# Patient Record
Sex: Male | Born: 1970 | Race: White | Hispanic: No | Marital: Single | Smoking: Former smoker
Health system: Southern US, Community
[De-identification: ages and names within clinical notes are randomized; demographics above are authoritative.]

## PROBLEM LIST (undated history)

## (undated) DIAGNOSIS — K219 Gastro-esophageal reflux disease without esophagitis: Secondary | ICD-10-CM

## (undated) DIAGNOSIS — T783XXA Angioneurotic edema, initial encounter: Secondary | ICD-10-CM

## (undated) HISTORY — DX: Gastro-esophageal reflux disease without esophagitis: K21.9

## (undated) HISTORY — DX: Angioneurotic edema, initial encounter: T78.3XXA

## (undated) HISTORY — PX: NO PAST SURGERIES: SHX2092

---

## 2012-09-11 ENCOUNTER — Other Ambulatory Visit: Payer: Self-pay | Admitting: Internal Medicine

## 2012-09-11 DIAGNOSIS — R109 Unspecified abdominal pain: Secondary | ICD-10-CM

## 2012-09-20 ENCOUNTER — Ambulatory Visit
Admission: RE | Admit: 2012-09-20 | Discharge: 2012-09-20 | Disposition: A | Discharge status: Home / Self Care | Payer: PRIVATE HEALTH INSURANCE | Source: Ambulatory Visit | Attending: Internal Medicine | Admitting: Internal Medicine

## 2012-09-20 ENCOUNTER — Other Ambulatory Visit: Payer: Self-pay

## 2012-09-20 DIAGNOSIS — R109 Unspecified abdominal pain: Secondary | ICD-10-CM

## 2012-09-20 MED ORDER — IOHEXOL 300 MG/ML  SOLN
125.0000 mL | Freq: Once | INTRAMUSCULAR | Status: AC | PRN
  Administered 2012-09-20: 125 mL via INTRAVENOUS

## 2014-01-23 DIAGNOSIS — Z0000 Encounter for general adult medical examination without abnormal findings: Secondary | ICD-10-CM | POA: Insufficient documentation

## 2021-03-17 ENCOUNTER — Other Ambulatory Visit: Payer: Self-pay | Admitting: Internal Medicine

## 2021-03-17 DIAGNOSIS — I10 Essential (primary) hypertension: Secondary | ICD-10-CM

## 2021-04-17 ENCOUNTER — Encounter: Payer: Self-pay | Admitting: Family Medicine

## 2021-05-21 ENCOUNTER — Ambulatory Visit
Admission: RE | Admit: 2021-05-21 | Discharge: 2021-05-21 | Disposition: A | Discharge status: Home / Self Care | Payer: No Typology Code available for payment source | Source: Ambulatory Visit | Attending: Internal Medicine | Admitting: Internal Medicine

## 2021-05-21 DIAGNOSIS — I10 Essential (primary) hypertension: Secondary | ICD-10-CM

## 2021-06-25 ENCOUNTER — Other Ambulatory Visit: Payer: Self-pay

## 2021-06-25 DIAGNOSIS — K219 Gastro-esophageal reflux disease without esophagitis: Secondary | ICD-10-CM | POA: Insufficient documentation

## 2021-06-26 ENCOUNTER — Other Ambulatory Visit: Payer: Self-pay

## 2021-06-26 ENCOUNTER — Ambulatory Visit (INDEPENDENT_AMBULATORY_CARE_PROVIDER_SITE_OTHER): Payer: Self-pay | Admitting: Cardiology

## 2021-06-26 ENCOUNTER — Encounter: Payer: Self-pay | Admitting: Cardiology

## 2021-06-26 VITALS — BP 110/72 | HR 87 | Ht 73.0 in | Wt 210.0 lb

## 2021-06-26 DIAGNOSIS — I10 Essential (primary) hypertension: Secondary | ICD-10-CM

## 2021-06-26 DIAGNOSIS — I2584 Coronary atherosclerosis due to calcified coronary lesion: Secondary | ICD-10-CM

## 2021-06-26 DIAGNOSIS — I2510 Atherosclerotic heart disease of native coronary artery without angina pectoris: Secondary | ICD-10-CM

## 2021-06-26 DIAGNOSIS — R9431 Abnormal electrocardiogram [ECG] [EKG]: Secondary | ICD-10-CM

## 2021-06-26 DIAGNOSIS — E785 Hyperlipidemia, unspecified: Secondary | ICD-10-CM

## 2021-06-26 DIAGNOSIS — R079 Chest pain, unspecified: Secondary | ICD-10-CM

## 2021-06-26 MED ORDER — METOPROLOL TARTRATE 50 MG PO TABS: ORAL_TABLET | ORAL | 0 refills | Status: DC

## 2021-06-26 MED ORDER — NITROGLYCERIN 0.4 MG SL SUBL: 0.4000 mg | SUBLINGUAL_TABLET | SUBLINGUAL | 3 refills | Status: DC | PRN

## 2021-06-26 NOTE — Patient Instructions (Addendum)
Medication Instructions:  Your physician has recommended you make the following change in your medication:  START: Nitroglycerin 0.4 mg take one tablet by mouth every 5 minutes up to three times as needed for chest pain.  *If you need a refill on your cardiac medications before your next appointment, please call your pharmacy*   Lab Work: Your physician recommends that you return for lab work in:  3-7 days beofre CT scan: BMET, Mag, Lpa - please come fasting If you have labs (blood work) drawn today and your tests are completely normal, you will receive your results only by: MyChart Message (if you have MyChart) OR A paper copy in the mail If you have any lab test that is abnormal or we need to change your treatment, we will call you to review the results.   Testing/Procedures:   Your cardiac CT will be scheduled at one of the below locations:   [facility] [address] [place], [place] [address] [phone]    If scheduled at [facility], please arrive at the [facility] main entrance (entrance A) of [facility] 30 minutes prior to test start time. Proceed to the [facility] Radiology Department (first floor) to check-in and test prep.    Please follow these instructions carefully (unless otherwise directed):  Hold all erectile dysfunction medications at least 3 days (72 hrs) prior to test.  On the Night Before the Test: Be sure to Drink plenty of water. Do not consume any caffeinated/decaffeinated beverages or chocolate 12 hours prior to your test. Do not take any antihistamines 12 hours prior to your test.   On the Day of the Test: Drink plenty of water until 1 hour prior to the test. Do not eat any food 4 hours prior to the test. You may take your regular medications prior to the test.  Take metoprolol (Lopressor) two hours prior to test.   After the Test: Drink plenty of water. After receiving IV contrast, you may experience a  mild flushed feeling. This is normal. On occasion, you may experience a mild rash up to 24 hours after the test. This is not dangerous. If this occurs, you can take Benadryl 25 mg and increase your fluid intake. If you experience trouble breathing, this can be serious. If it is severe call 911 IMMEDIATELY. If it is mild, please call our office. If you take any of these medications: Glipizide/Metformin, Avandament, Glucavance, please do not take 48 hours after completing test unless otherwise instructed.  Please allow 2-4 weeks for scheduling of routine cardiac CTs. Some insurance companies require a pre-authorization which may delay scheduling of this test.   For non-scheduling related questions, please contact the cardiac imaging nurse navigator should you have any questions/concerns: Rockwell Alexandria, Cardiac Imaging Nurse Navigator Larey Brick, Cardiac Imaging Nurse Navigator [facility] Direct Office Dial: [phone]   For scheduling needs, including cancellations and rescheduling, please call [place], [phone].    Follow-Up: At [facility], you and your health needs are our priority.  As part of our continuing mission to provide you with exceptional heart care, we have created designated Provider Care Teams.  These Care Teams include your primary Cardiologist (physician) and Advanced Practice Providers (APPs -  Physician Assistants and Nurse Practitioners) who all work together to provide you with the care you need, when you need it.  We recommend signing up for the patient portal called "MyChart".  Sign up information is provided on  this After Visit Summary.  MyChart is used to connect with patients for Virtual Visits (Telemedicine).  Patients are able to view lab/test results, encounter notes, upcoming appointments, etc.  Non-urgent messages can be sent to your provider as well.   To learn more about what you can do with MyChart, go to  [email or web address].    Your next appointment:   4 month(s)  The format for your next appointment:   In Person  Provider:   Elease Hashimoto - Thomasene Ripple, DO [address], [place], [place] [address]    Other Instructions

## 2021-06-26 NOTE — Progress Notes (Signed)
Cardiology Office Note:    Date:  06/26/2021   ID:  Trellis Moment, DOB [birth year], MRN [id number]  PCP:  Merri Brunette, MD  Cardiologist:  None  Electrophysiologist:  None   Referring MD: Merri Brunette, MD    History of Present Illness:    Larry Barber is a 50 y.o. male with a history significant for HTN, HLD, and former tobacco and alcohol use who was referred due to coronary calcium score of 610 (99%ile for age).   Patient denies any symptoms and feels well overall. He denies chest pain, dyspnea, dizziness/lightheadedness, or syncope. States the news of his calcium score and recent diagnosis of high cholesterol were a shock to him.   Patient reports he drank heavily and smoked 1PPD for approximately 20 years. One year ago he quit smoking and drinking, and has been committed to a much healthier lifestyle since that time. He is very active, enjoys playing golf and exercising in his home gym. He works with a Health and safety inspector and follows a Mediterranean-style diet. He has a longstanding history of HTN, which is well controlled on meds, and was recently diagnosed with high cholesterol. Family history significant for father with CABG at age 40 and paternal grandfather who died of sudden MI in his early 25s.   Past Medical History:  Diagnosis Date   GERD (gastroesophageal reflux disease)     Past Surgical History:  Procedure Laterality Date   NO PAST SURGERIES      Current Medications: Current Meds  Medication Sig   allopurinol (ZYLOPRIM) 100 MG tablet Take 100 mg by mouth daily.   ALPRAZolam (XANAX) 0.5 MG tablet Take 0.5 mg by mouth at bedtime as needed for sleep.   amLODipine (NORVASC) 5 MG tablet Take 5 mg by mouth daily.   aspirin 81 MG chewable tablet Chew 1 tablet by mouth daily.   Coenzyme Q10 (COQ10 PO) Take 300 mg by mouth daily.   esomeprazole (NEXIUM) 20 MG capsule Take 20 mg by mouth daily as needed (heart burn).   ferrous sulfate 220 (44 Fe) MG/5ML solution Take 10  mLs by mouth daily.   GARLIC PO Take 40 mg by mouth daily.   losartan (COZAAR) 100 MG tablet Take 100 mg by mouth daily.   Melatonin 3 MG SUBL Take 3 mg by mouth at bedtime.   Menaquinone-7 (VITAMIN K2 PO) Take 160 mg by mouth 2 (two) times daily.   metoprolol tartrate (LOPRESSOR) 50 MG tablet Take 2 hours before CT scan   Multiple Vitamins-Minerals (PHYTOMULTI PO) Take 1 tablet by mouth daily.   nitroGLYCERIN (NITROSTAT) 0.4 MG SL tablet Place 1 tablet (0.4 mg total) under the tongue every 5 (five) minutes as needed for chest pain. Up to three times.   OVER THE COUNTER MEDICATION Take 1 tablet by mouth daily. Blood Sugar Omega   rosuvastatin (CRESTOR) 20 MG tablet Take 1 tablet by mouth daily.   tamsulosin (FLOMAX) 0.4 MG CAPS capsule Take 0.4 mg by mouth daily as needed (urinary difficulty).   TURMERIC PO Take 200 mg by mouth daily.   vitamin C (ASCORBIC ACID) 500 MG tablet Take 500 mg by mouth 3 (three) times daily.   [DISCONTINUED] allopurinol (ZYLOPRIM) 100 MG tablet Take 1 tablet by mouth daily.     Allergies:   Clindamycin/lincomycin, Chantix [varenicline], Naproxen, and Zoloft [sertraline]   Social History   Socioeconomic History   Marital status: Single    Spouse name: Not on file   Number of children: Not on file  Years of education: Not on file   Highest education level: Not on file  Occupational History   Not on file  Tobacco Use   Smoking status: Former    Types: Cigarettes   Smokeless tobacco: Current    Types: Chew  Substance and Sexual Activity   Alcohol use: Never   Drug use: Never   Sexual activity: Not on file  Other Topics Concern   Not on file  Social History Narrative   Not on file   Social Determinants of Health   Financial Resource Strain: Not on file  Food Insecurity: Not on file  Transportation Needs: Not on file  Physical Activity: Not on file  Stress: Not on file  Social Connections: Not on file     Family History: The patient's family  history includes Allergies in his half-brother; Heart disease in his father and paternal grandfather; Other in his mother.  ROS:   Review of Systems  Constitution: Negative for decreased appetite, fever and weight gain.  HENT: Negative for congestion, ear discharge, hoarse voice and sore throat.   Eyes: Negative for discharge, redness, vision loss in right eye and visual halos.  Cardiovascular: Negative for chest pain, dyspnea on exertion, leg swelling, orthopnea and palpitations.  Respiratory: Negative for cough, hemoptysis, shortness of breath and snoring.   Endocrine: Negative for heat intolerance and polyphagia.  Hematologic/Lymphatic: Negative for bleeding problem. Does not bruise/bleed easily.  Skin: Negative for flushing, nail changes, rash and suspicious lesions.  Musculoskeletal: Negative for arthritis, joint pain, muscle cramps, myalgias, neck pain and stiffness.  Gastrointestinal: Negative for abdominal pain, bowel incontinence, diarrhea and excessive appetite.  Genitourinary: Negative for decreased libido, genital sores and incomplete emptying.  Neurological: Negative for brief paralysis, focal weakness, headaches and loss of balance.  Psychiatric/Behavioral: Negative for altered mental status, depression and suicidal ideas.  Allergic/Immunologic: Negative for HIV exposure and persistent infections.    EKGs/Labs/Other Studies Reviewed:    The following studies were reviewed today:  Calcium score  Total score 610 Left main 0, LAD 262, LCx 18, RCA 330  Lipids  03/12/2021 Total cholesterol 193, triglycerides 296, HDL 33, LDL 100  EKG:  The ekg ordered today demonstrates normal sinus rhythm, poor R wave progression   Physical Exam:    VS:  BP 110/72 (BP Location: Left Arm, Patient Position: Sitting, Cuff Size: Normal)   Pulse 87   Ht  (1.854 m)   Wt 210 lb (95.3 kg)   SpO2 97%   BMI 27.71 kg/m     Wt Readings from Last 3 Encounters:  06/26/21 210 lb (95.3 kg)      GEN: Well nourished, well developed in no acute distress HEENT: Normal NECK: No JVD; No carotid bruits LYMPHATICS: No lymphadenopathy CARDIAC: S1S2 noted,RRR, no murmurs, rubs, gallops RESPIRATORY:  Clear to auscultation without rales, wheezing or rhonchi  ABDOMEN: Soft, non-tender, non-distended, +bowel sounds, no guarding. EXTREMITIES: No edema, No cyanosis, no clubbing MUSCULOSKELETAL:  No deformity  SKIN: Warm and dry NEUROLOGIC:  Alert and oriented x 3, non-focal PSYCHIATRIC:  Normal affect, good insight  ASSESSMENT:    1. Nonspecific abnormal electrocardiogram (ECG) (EKG)   2. Hyperlipidemia, unspecified hyperlipidemia type   3. Hypertension, unspecified type   4. Coronary artery calcification   5. Chest pain, unspecified type    PLAN:    Coronary artery calcification: We discussed his significantly burden of calcium score for age (610, 99%ile).  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 He will continue ASA and Crestor. Patient given Rx for nitroglycerin in the event he develops anginal symptoms. Counseled on appropriate use of nitro, including concurrent use of Viagra/Cialis. HLD- lipid profile last month showed elevated triglycerides 296, slightly low HDL 33 and LDL of 100. Continue Crestor.  We will repeat his lipid profile at his next visit goal for him now is less than 70.  Will check lipoprotein a levels today.   HTN- BP well controlled. Continue current regimen (Losartan 100mg  and Amlodipine 5mg  daily). The patient understands the need to lose weight with diet and exercise. We have discussed specific strategies for this.   The patient is in agreement with the above plan. The patient left the office in stable condition.  The patient will follow up in 4 months.   Medication Adjustments/Labs and Tests Ordered: Current medicines  are reviewed at length with the patient today.  Concerns regarding medicines are outlined above.  Orders Placed This Encounter  Procedures   CT CORONARY MORPH W/CTA COR W/SCORE W/CA W/CM &/OR WO/CM   Lipoprotein A (LPA)   Basic metabolic panel   Magnesium   EKG 12-Lead    Meds ordered this encounter  Medications   nitroGLYCERIN (NITROSTAT) 0.4 MG SL tablet    Sig: Place 1 tablet (0.4 mg total) under the tongue every 5 (five) minutes as needed for chest pain. Up to three times.    Dispense:  25 tablet    Refill:  3   metoprolol tartrate (LOPRESSOR) 50 MG tablet    Sig: Take 2 hours before CT scan    Dispense:  1 tablet    Refill:  0     Patient Instructions  Medication Instructions:  Your physician has recommended you make the following change in your medication:  START: Nitroglycerin 0.4 mg take one tablet by mouth every 5 minutes up to three times as needed for chest pain.  *If you need a refill on your cardiac medications before your next appointment, please call your pharmacy*   Lab Work: Your physician recommends that you return for lab work in:  3-7 days beofre CT scan: BMET, Mag, Lpa - please come fasting If you have labs (blood work) drawn today and your tests are completely normal, you will receive your results only by: MyChart Message (if you have MyChart) OR A paper copy in the mail If you have any lab test that is abnormal or we need to change your treatment, we will call you to review the results.   Testing/Procedures:   Your cardiac CT will be scheduled at one of the below locations:   [facility] [address] [place], [address] [place] [phone]    If scheduled at [facility], please arrive at the [facility] main entrance (entrance A) of [facility] 30 minutes prior to test start time. Proceed to the [facility] Radiology Department (first floor) to check-in and test prep.    Please follow these instructions  carefully (unless otherwise directed):  Hold all erectile dysfunction medications at least 3 days (72 hrs) prior to test.  On the Night Before the Test: Be sure to Drink plenty of water. Do not consume any caffeinated/decaffeinated beverages or chocolate 12 hours prior to your test. Do not take any antihistamines 12 hours prior to your test.   On the Day of the Test: Drink plenty  of water until 1 hour prior to the test. Do not eat any food 4 hours prior to the test. You may take your regular medications prior to the test.  Take metoprolol (Lopressor) two hours prior to test.   After the Test: Drink plenty of water. After receiving IV contrast, you may experience a mild flushed feeling. This is normal. On occasion, you may experience a mild rash up to 24 hours after the test. This is not dangerous. If this occurs, you can take Benadryl 25 mg and increase your fluid intake. If you experience trouble breathing, this can be serious. If it is severe call 911 IMMEDIATELY. If it is mild, please call our office. If you take any of these medications: Glipizide/Metformin, Avandament, Glucavance, please do not take 48 hours after completing test unless otherwise instructed.  Please allow 2-4 weeks for scheduling of routine cardiac CTs. Some insurance companies require a pre-authorization which may delay scheduling of this test.   For non-scheduling related questions, please contact the cardiac imaging nurse navigator should you have any questions/concerns: Rockwell Alexandria, Cardiac Imaging Nurse Navigator Larey Brick, Cardiac Imaging Nurse Navigator [facility] Direct Office Dial: [phone]   For scheduling needs, including cancellations and rescheduling, please call [place], [phone].    Follow-Up: At [facility], you and your health needs are our priority.  As part of our continuing mission to provide you with exceptional heart care, we have created  designated Provider Care Teams.  These Care Teams include your primary Cardiologist (physician) and Advanced Practice Providers (APPs -  Physician Assistants and Nurse Practitioners) who all work together to provide you with the care you need, when you need it.  We recommend signing up for the patient portal called "MyChart".  Sign up information is provided on this After Visit Summary.  MyChart is used to connect with patients for Virtual Visits (Telemedicine).  Patients are able to view lab/test results, encounter notes, upcoming appointments, etc.  Non-urgent messages can be sent to your provider as well.   To learn more about what you can do with MyChart, go to [email or web address].    Your next appointment:   4 month(s)  The format for your next appointment:   In Person  Provider:   Elease Hashimoto - Thomasene Ripple, DO [address], [place], [place] [address]    Other Instructions    Adopting a Healthy Lifestyle.  Know what a healthy weight is for you (roughly BMI <25) and aim to maintain this   Aim for 7+ servings of fruits and vegetables daily   65-80+ fluid ounces of water or unsweet tea for healthy kidneys   Limit to max 1 drink of alcohol per day; avoid smoking/tobacco   Limit animal fats in diet for cholesterol and heart health - choose grass fed whenever available   Avoid highly processed foods, and foods high in saturated/trans fats   Aim for low stress - take time to unwind and care for your mental health   Aim for 150 min of moderate intensity exercise weekly for heart health, and weights twice weekly for bone health   Aim for 7-9 hours of sleep daily   When it comes to diets, agreement about the perfect plan isnt easy to find, even among the experts. Experts at the [facility] developed an idea known as the Healthy Eating Plate. Just imagine a plate divided into logical, healthy portions.   The emphasis is on diet quality:  Load up  on vegetables and fruits - one-half of your plate: Aim for color and variety, and remember that potatoes dont count.   Go for whole grains - one-quarter of your plate: Whole wheat, barley, wheat berries, quinoa, oats, brown rice, and foods made with them. If you want pasta, go with whole wheat pasta.   Protein power - one-quarter of your plate: Fish, chicken, beans, and nuts are all healthy, versatile protein sources. Limit red meat.   The diet, however, does go beyond the plate, offering a few other suggestions.   Use healthy plant oils, such as olive, canola, soy, corn, sunflower and peanut. Check the labels, and avoid partially hydrogenated oil, which have unhealthy trans fats.   If youre thirsty, drink water. Coffee and tea are good in moderation, but skip sugary drinks and limit milk and dairy products to one or two daily servings.   The type of carbohydrate in the diet is more important than the amount. Some sources of carbohydrates, such as vegetables, fruits, whole grains, and beans-are healthier than others.   Finally, stay active  Signed, Maury DusAshleigh Gracia Saggese, MD  06/26/2021 10:37 AM    [facility]

## 2021-07-01 ENCOUNTER — Telehealth (HOSPITAL_COMMUNITY): Payer: Self-pay | Admitting: Emergency Medicine

## 2021-07-01 NOTE — Telephone Encounter (Signed)
Reaching out to patient to offer assistance regarding upcoming cardiac imaging study; pt verbalizes understanding of appt date/time, parking situation and where to check in, pre-test NPO status and medications ordered, and verified current allergies; name and call back number provided for further questions should they arise Rockwell Alexandria RN Navigator Cardiac Imaging [facility] [phone] office [phone] cell  Denies claustro Denies iv issues Plans to get labs tomorrow 50mg  metoprolol (BP soft during last OV)

## 2021-07-03 ENCOUNTER — Ambulatory Visit (HOSPITAL_COMMUNITY)
Admission: RE | Admit: 2021-07-03 | Discharge: 2021-07-03 | Disposition: A | Discharge status: Home / Self Care | Payer: Self-pay | Source: Ambulatory Visit | Attending: Cardiology | Admitting: Cardiology

## 2021-07-03 ENCOUNTER — Other Ambulatory Visit (HOSPITAL_BASED_OUTPATIENT_CLINIC_OR_DEPARTMENT_OTHER): Payer: Self-pay | Admitting: Cardiology

## 2021-07-03 ENCOUNTER — Other Ambulatory Visit: Payer: Self-pay

## 2021-07-03 DIAGNOSIS — R072 Precordial pain: Secondary | ICD-10-CM

## 2021-07-03 DIAGNOSIS — R079 Chest pain, unspecified: Secondary | ICD-10-CM | POA: Insufficient documentation

## 2021-07-03 DIAGNOSIS — R931 Abnormal findings on diagnostic imaging of heart and coronary circulation: Secondary | ICD-10-CM

## 2021-07-03 DIAGNOSIS — I700 Atherosclerosis of aorta: Secondary | ICD-10-CM

## 2021-07-03 LAB — BASIC METABOLIC PANEL
BUN/Creatinine Ratio: 15 (ref 9–20)
BUN: 15 mg/dL (ref 6–24)
CO2: 25 mmol/L (ref 20–29)
Calcium: 9.9 mg/dL (ref 8.7–10.2)
Chloride: 98 mmol/L (ref 96–106)
Creatinine, Ser: 0.97 mg/dL (ref 0.76–1.27)
Glucose: 130 mg/dL — ABNORMAL HIGH (ref 65–99)
Potassium: 3.9 mmol/L (ref 3.5–5.2)
Sodium: 137 mmol/L (ref 134–144)
eGFR: 96 mL/min/{1.73_m2} (ref >59)

## 2021-07-03 LAB — MAGNESIUM: Magnesium: 1.8 mg/dL (ref 1.6–2.3)

## 2021-07-03 LAB — LIPOPROTEIN A (LPA): Lipoprotein (a): 167.2 nmol/L — ABNORMAL HIGH (ref <75.0)

## 2021-07-03 MED ORDER — NITROGLYCERIN 0.4 MG SL SUBL
SUBLINGUAL_TABLET | SUBLINGUAL | Status: AC
  Filled 2021-07-03: qty 2

## 2021-07-03 MED ORDER — NITROGLYCERIN 0.4 MG SL SUBL
0.8000 mg | SUBLINGUAL_TABLET | SUBLINGUAL | Status: DC | PRN
  Administered 2021-07-03: 0.8 mg via SUBLINGUAL

## 2021-07-03 MED ORDER — IOHEXOL 350 MG/ML SOLN
95.0000 mL | Freq: Once | INTRAVENOUS | Status: AC | PRN
  Administered 2021-07-03: 95 mL via INTRAVENOUS

## 2021-07-07 ENCOUNTER — Telehealth: Payer: Self-pay | Admitting: Cardiology

## 2021-07-07 ENCOUNTER — Telehealth: Payer: Self-pay

## 2021-07-07 ENCOUNTER — Telehealth (INDEPENDENT_AMBULATORY_CARE_PROVIDER_SITE_OTHER): Payer: Self-pay | Admitting: Cardiology

## 2021-07-07 ENCOUNTER — Other Ambulatory Visit: Payer: Self-pay

## 2021-07-07 ENCOUNTER — Encounter: Payer: Self-pay | Admitting: Cardiology

## 2021-07-07 VITALS — Ht 74.0 in | Wt 212.0 lb

## 2021-07-07 DIAGNOSIS — I10 Essential (primary) hypertension: Secondary | ICD-10-CM

## 2021-07-07 DIAGNOSIS — E782 Mixed hyperlipidemia: Secondary | ICD-10-CM

## 2021-07-07 DIAGNOSIS — I2510 Atherosclerotic heart disease of native coronary artery without angina pectoris: Secondary | ICD-10-CM

## 2021-07-07 NOTE — Addendum Note (Signed)
Addended by: Eleonore Chiquito on: 07/07/2021 12:06 PM   Modules accepted: Orders

## 2021-07-07 NOTE — Telephone Encounter (Signed)
Mr. dewan, emond are scheduled for a virtual visit with your provider today.    Just as we do with appointments in the office, we must obtain your consent to participate.  Your consent will be active for this visit and any virtual visit you may have with one of our providers in the next 365 days.    If you have a MyChart account, I can also send a copy of this consent to you electronically.  All virtual visits are billed to your insurance company just like a traditional visit in the office.  As this is a virtual visit, video technology does not allow for your provider to perform a traditional examination.  This may limit your provider's ability to fully assess your condition.  If your provider identifies any concerns that need to be evaluated in person or the need to arrange testing such as labs, EKG, etc, we will make arrangements to do so.    Although advances in technology are sophisticated, we cannot ensure that it will always work on either your end or our end.  If the connection with a video visit is poor, we may have to switch to a telephone visit.  With either a video or telephone visit, we are not always able to ensure that we have a secure connection.   I need to obtain your verbal consent now.   Are you willing to proceed with your visit today?   Knut Rondinelli has provided verbal consent on 07/07/2021 for a virtual visit (video or telephone).   Dione Housekeeper, CMA 07/07/2021  11:06 AM

## 2021-07-07 NOTE — Telephone Encounter (Signed)
Patient states he is having trouble logging on for his appointment today.

## 2021-07-07 NOTE — Progress Notes (Signed)
Virtual Visit via Telephone Note   This visit type was conducted due to national recommendations for restrictions regarding the COVID-19 Pandemic (e.g. social distancing) in an effort to limit this patient's exposure and mitigate transmission in our community.  Due to his co-morbid illnesses, this patient is at least at moderate risk for complications without adequate follow up.  This format is felt to be most appropriate for this patient at this time.  The patient did not have access to video technology/had technical difficulties with video requiring transitioning to audio format only (telephone).  All issues noted in this document were discussed and addressed.  No physical exam could be performed with this format.  Please refer to the patient's chart for his  consent to telehealth for [facility].   Date:  07/07/2021   ID:  Larry Barber, DOB 10-28-1971, MRN [id number]  Patient Location: Home virtual Visit via telephone Note I connected with the patient on July 07, 2021 by a telephone enabled telemedicine application and verified that I am speaking with the correct person using two identifiers.   Provider Location: Office/Clinic  PCP:  Merri Brunette, MD  Cardiologist:  None  Electrophysiologist:  None   Evaluation Performed:  Follow-Up Visit  Chief Complaint:    History of Present Illness:    Larry Barber is a 50 y.o. male with coronary artery disease which was appreciated on coronary CTA, hyperlipidemia, hypertension is here today for follow-up visit.  First saw the patient in June 26, 2021 at that time he had had a coronary CTA which show significant burden of calcium.  Referral sent the patient for coronary CT scan given his abnormal EKG.  He was able to get his testing done is here today for follow-up visit.  He denies any chest pain.  The patient does not have symptoms concerning for COVID-19 infection (fever, chills, cough, or new shortness of breath).    Past  Medical History:  Diagnosis Date   GERD (gastroesophageal reflux disease)    Past Surgical History:  Procedure Laterality Date   NO PAST SURGERIES       Current Meds  Medication Sig   allopurinol (ZYLOPRIM) 100 MG tablet Take 100 mg by mouth daily.   ALPRAZolam (XANAX) 0.5 MG tablet Take 0.5 mg by mouth at bedtime as needed for sleep.   amLODipine (NORVASC) 5 MG tablet Take 5 mg by mouth daily.   aspirin 81 MG chewable tablet Chew 1 tablet by mouth daily.   Coenzyme Q10 (COQ10 PO) Take 300 mg by mouth daily.   esomeprazole (NEXIUM) 20 MG capsule Take 20 mg by mouth daily as needed (heart burn).   ferrous sulfate 220 (44 Fe) MG/5ML solution Take 10 mLs by mouth daily.   GARLIC PO Take 40 mg by mouth daily.   losartan (COZAAR) 100 MG tablet Take 100 mg by mouth daily.   Melatonin 3 MG SUBL Take 3 mg by mouth at bedtime.   Menaquinone-7 (VITAMIN K2 PO) Take 160 mg by mouth 2 (two) times daily.   Multiple Vitamins-Minerals (PHYTOMULTI PO) Take 1 tablet by mouth daily.   nitroGLYCERIN (NITROSTAT) 0.4 MG SL tablet Place 1 tablet (0.4 mg total) under the tongue every 5 (five) minutes as needed for chest pain. Up to three times.   OVER THE COUNTER MEDICATION Take 1 tablet by mouth daily. Blood Sugar Omega   rosuvastatin (CRESTOR) 20 MG tablet Take 1 tablet by mouth daily.   tamsulosin (FLOMAX) 0.4 MG CAPS capsule Take 0.4  mg by mouth daily as needed (urinary difficulty).   TURMERIC PO Take 200 mg by mouth daily.   vitamin C (ASCORBIC ACID) 500 MG tablet Take 500 mg by mouth 3 (three) times daily.     Allergies:   Clindamycin/lincomycin, Chantix [varenicline], Naproxen, and Zoloft [sertraline]   Social History   Tobacco Use   Smoking status: Former    Types: Cigarettes   Smokeless tobacco: Current    Types: Chew  Substance Use Topics   Alcohol use: Never   Drug use: Never     Family Hx: The patient's family history includes Allergies in his half-brother; Heart disease in his  father and paternal grandfather; Other in his mother.  ROS:   Please see the history of present illness.     All other systems reviewed and are negative.   Prior CV studies:   The following studies were reviewed today:  CCTA 07/03/2021 FINDINGS: Coronary calcium score: The patient's coronary artery calcium score is 432, which places the patient in the 99th percentile.   Coronary arteries: Normal coronary origins.  Right dominance.   Right Coronary Artery: Normal caliber vessel, gives rise to PDA. There is focal mixed calcified and noncalcified plaque in the proximal RCA, with 25-49% stenosis. There is focal mixed calcified and noncalcified plaque in the mid RCA, with 25-49% stenosis. There is mixed calcified and noncalcified plaque in the distal RCA with 1-24% stenosis.   Left Main Coronary Artery: Normal caliber vessel. No significant plaque or stenosis.   Left Anterior Descending Coronary Artery: Normal caliber vessel. Diffused mixed calcified and noncalcified plaque. There is a focal mixed plaque at the origin of the LAD, with 1-24% stenosis. There is a focal calcified plaque in the mid LAD with 25-49% stenosis. Gives rise to 2 diagonal branches.   Left Circumflex Artery: Normal caliber vessel. There is scattered noncalcified plaque without significant stenosis. Gives rise to 2 OM branches.   Aorta: Normal size, 30 mm at the mid ascending aorta (level of the PA bifurcation) measured double oblique. Aortic atherosclerosis. No dissection seen in visualized portions of the aorta.   Aortic Valve: No calcifications. Trileaflet.   Other findings:   Normal pulmonary vein drainage into the left atrium.   Normal left atrial appendage without a thrombus.   Normal size of the pulmonary artery.   Normal appearance of the pericardium.   Step artifact is present, but images are interpretable.   IMPRESSION: 1. At least mild nonobstructive CAD, CADRADS = 2. Cannot  exclude flow limiting stenosis based on available image, so CT FFR will be performed and reported separately.   2. Coronary calcium score of 432. This was 99th percentile for age and sex matched control. Of note, prior calcium score 610, discrepancy likely due to difference between scanners.   3. Normal coronary origin with right dominance.   There is significant step artifact, with focal areas of the LAD, LCx, and RCA unable to be fully evaluated. The rest of the portions of the vessels are interpretable, but cannot evaluate for stenosis in the artifact-impacted sections.    Labs/Other Tests and Data Reviewed:    EKG:  No ECG reviewed.  Recent Labs: 07/02/2021: BUN 15; Creatinine, Ser 0.97; Magnesium 1.8; Potassium 3.9; Sodium 137   Recent Lipid Panel No results found for: CHOL, TRIG, HDL, CHOLHDL, LDLCALC, LDLDIRECT  Wt Readings from Last 3 Encounters:  07/07/21 212 lb (96.2 kg)  06/26/21 210 lb (95.3 kg)     Objective:  Vital Signs:  Ht 6\' 2"  (1.88 m)   Wt 212 lb (96.2 kg)   BMI 27.22 kg/m    Virtual visit physical exam not performed  ASSESSMENT & PLAN:    Coronary artery disease Hyperlipidemia Hypertension P  I discussed with the patient about his coronary CTA which did show evidence of coronary artery disease which is nonobstructive.  He is asymptomatic so definitely medication for secondary prevention is the way to go at this time.  We will continue his aspirin and Crestor.  We talked about his result for his LP(a) which is also elevated.  For now he will remain on the Crestor a repeat lab in 6 weeks.  Should this be elevated or if the patient does not tolerate statins the plan will be PCSK9 inhibitors per  I educated patient on how to use nitroglycerin as needed.  Blood pressure is acceptable, continue with current antihypertensive regimen.  He will follow-up in 6 months.  COVID-19 Education: The signs and symptoms of COVID-19 were discussed with the  patient and how to seek care for testing (follow up with PCP or arrange E-visit).  The importance of social distancing was discussed today.  Time:   Today, I have spent 10 minutes with the patient with telehealth technology discussing the above problems.     Medication Adjustments/Labs and Tests Ordered: Current medicines are reviewed at length with the patient today.  Concerns regarding medicines are outlined above.   Tests Ordered: No orders of the defined types were placed in this encounter.   Medication Changes: No orders of the defined types were placed in this encounter.   Follow Up:  In Person in 6 month(s)  Signed, , DO  07/07/2021 11:48 AM    [facility]

## 2021-07-07 NOTE — Patient Instructions (Addendum)
Medication Instructions:  No medication changes. *If you need a refill on your cardiac medications before your next appointment, please call your pharmacy*   Lab Work: Your physician recommends that you return for lab work in: 6 weeks You need to have labs done when you are fasting.  You can come Monday through Friday 8:30 am to 12:00 pm and 1:15 to 4:30. You do not need to make an appointment as the order has already been placed. The labs you are going to have done are LpA and Lipids.  If you have labs (blood work) drawn today and your tests are completely normal, you will receive your results only by: MyChart Message (if you have MyChart) OR A paper copy in the mail If you have any lab test that is abnormal or we need to change your treatment, we will call you to review the results.   Testing/Procedures: None ordered   Follow-Up: At [facility], you and your health needs are our priority.  As part of our continuing mission to provide you with exceptional heart care, we have created designated Provider Care Teams.  These Care Teams include your primary Cardiologist (physician) and Advanced Practice Providers (APPs -  Physician Assistants and Nurse Practitioners) who all work together to provide you with the care you need, when you need it.  We recommend signing up for the patient portal called "MyChart".  Sign up information is provided on this After Visit Summary.  MyChart is used to connect with patients for Virtual Visits (Telemedicine).  Patients are able to view lab/test results, encounter notes, upcoming appointments, etc.  Non-urgent messages can be sent to your provider as well.   To learn more about what you can do with MyChart, go to [email or web address].    Your next appointment:   As scheduled.  The format for your next appointment:   In Person  Provider:   Thomasene Ripple, MD   Other Instructions NA

## 2021-08-10 NOTE — Addendum Note (Signed)
Addended by: Hazle Quant on: 08/10/2021 09:45 AM   Modules accepted: Orders

## 2021-08-11 LAB — LIPID PANEL
Chol/HDL Ratio: 3.4 ratio (ref 0.0–5.0)
Cholesterol, Total: 130 mg/dL (ref 100–199)
HDL: 38 mg/dL — ABNORMAL LOW (ref >39)
LDL Chol Calc (NIH): 57 mg/dL (ref 0–99)
Triglycerides: 214 mg/dL — ABNORMAL HIGH (ref 0–149)
VLDL Cholesterol Cal: 35 mg/dL (ref 5–40)

## 2021-08-11 LAB — LIPOPROTEIN A (LPA): Lipoprotein (a): 237.8 nmol/L — ABNORMAL HIGH (ref <75.0)

## 2021-08-14 ENCOUNTER — Other Ambulatory Visit: Payer: Self-pay

## 2021-08-14 DIAGNOSIS — E782 Mixed hyperlipidemia: Secondary | ICD-10-CM

## 2021-08-19 ENCOUNTER — Other Ambulatory Visit: Payer: Self-pay

## 2021-08-19 DIAGNOSIS — E782 Mixed hyperlipidemia: Secondary | ICD-10-CM

## 2021-08-19 LAB — LIPID PANEL
Chol/HDL Ratio: 3.3 ratio (ref 0.0–5.0)
Cholesterol, Total: 132 mg/dL (ref 100–199)
HDL: 40 mg/dL (ref >39)
LDL Chol Calc (NIH): 59 mg/dL (ref 0–99)
Triglycerides: 203 mg/dL — ABNORMAL HIGH (ref 0–149)
VLDL Cholesterol Cal: 33 mg/dL (ref 5–40)

## 2021-08-19 LAB — LIPOPROTEIN A (LPA): Lipoprotein (a): 234.2 nmol/L — ABNORMAL HIGH (ref <75.0)

## 2021-08-19 NOTE — Progress Notes (Signed)
Referral placed.

## 2021-08-30 IMAGING — CT CT HEART MORP W/ CTA COR W/ SCORE W/ CA W/CM &/OR W/O CM
1 of 13 series · 2 of 20 positions shown, 3 images · IV contrast (APPLIED)
Comparison: 05/21/2021 calcium score
COMPARISON: 05/21/2021 calcium score

Addendum:
EXAM:
OVER-READ INTERPRETATION  CT CHEST

The following report is an over-read performed by radiologist Dr.
Gardenia Nordstrom [[email or web address]] on 07/03/2021. This over-read
does not include interpretation of cardiac or coronary anatomy or
pathology. The coronary CTA interpretation by the cardiologist is
attached.
HISTORY: Chest pain/anginal equiv, ECGs or troponins abnormal Coronary
calcification
Cardiac/Coronary CT
TECHNIQUE: The patient was scanned on a Siemens Force scanner.
PROTOCOL: A 100 kV prospective scan was triggered in the descending thoracic
aorta at 111 HU's. Axial non-contrast 3 mm slices were carried out
through the heart. The data set was analyzed on a dedicated work
station and scored using the Agatson method. Gantry rotation speed
was 250 msecs and collimation was 0.6 mm. Heart rate optimized
medically, and 0.8 mg of sublingual nitroglycerin was given. The 3D
data set was reconstructed in 5% intervals of 35-75% of the R-R
cycle. Diastolic phases were analyzed on a dedicated work station
using MPR, MIP and VRT modes. The patient received 95mL OMNIPAQUE
IOHEXOL 350 MG/ML SOLN of contrast.

[Series 7: best syst · axial · 0.40mm/px · z∈[+1184,+1314]mm · 2 of 328 slices shown, 3 images]
[im 1/328  vessel]
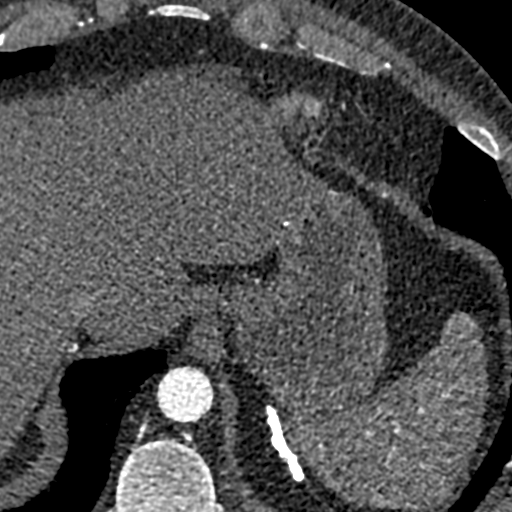
[im 1/328  lung]
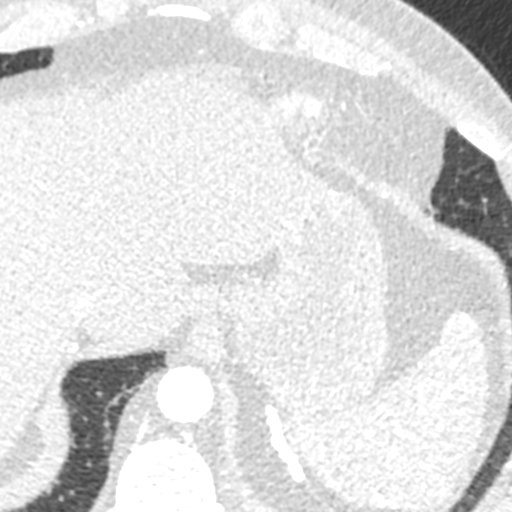
[im 328/328  vessel]
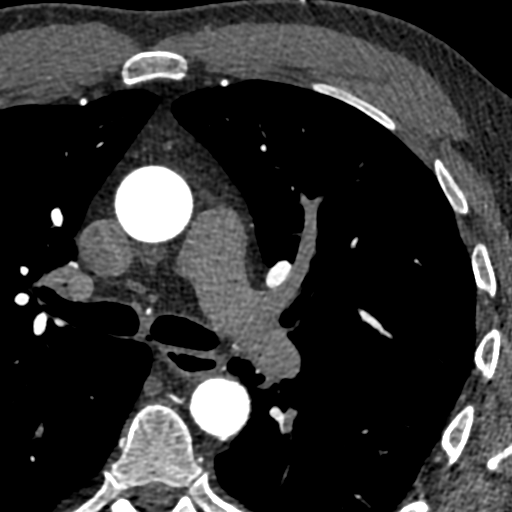

[2 of 20 positions shown; findings below may reference images not displayed]

FINDINGS: Vascular: Aortic atherosclerosis. Pulmonary arteries not well
evaluated secondary to bolus timing.

Mediastinum/Nodes: No imaged thoracic adenopathy. Esophageal fluid
level is subtle on [DATE].

Lungs/Pleura: No pleural fluid.  Mild centrilobular emphysema.

Minimal motion degradation inferiorly.

Left lower lobe scarring.

Upper Abdomen: Normal imaged portions of the liver, spleen, stomach.

Musculoskeletal: No acute osseous abnormality.
IMPRESSION: 1.  No acute findings in the imaged extracardiac chest.
2.  Aortic Atherosclerosis (O94W1-4I5.5).  Emphysema (O94W1-S92.1).
3. Esophageal air fluid level suggests dysmotility or
gastroesophageal reflux.
FINDINGS: Coronary calcium score: The patient's coronary artery calcium score
is 432, which places the patient in the 99th percentile.

Coronary arteries: Normal coronary origins.  Right dominance.

Right Coronary Artery: Normal caliber vessel, gives rise to PDA.
There is focal mixed calcified and noncalcified plaque in the
proximal RCA, with 25-49% stenosis. There is focal mixed calcified
and noncalcified plaque in the mid RCA, with 25-49% stenosis. There
is mixed calcified and noncalcified plaque in the distal RCA with
1-24% stenosis.

Left Main Coronary Artery: Normal caliber vessel. No significant
plaque or stenosis.

Left Anterior Descending Coronary Artery: Normal caliber vessel.
Diffused mixed calcified and noncalcified plaque. There is a focal
mixed plaque at the origin of the LAD, with 1-24% stenosis. There is
a focal calcified plaque in the mid LAD with 25-49% stenosis. Gives
rise to 2 diagonal branches.

Left Circumflex Artery: Normal caliber vessel. There is scattered
noncalcified plaque without significant stenosis. Gives rise to 2 OM
branches.

Aorta: Normal size, 30 mm at the mid ascending aorta (level of the
PA bifurcation) measured double oblique. Aortic atherosclerosis. No
dissection seen in visualized portions of the aorta.

Aortic Valve: No calcifications. Trileaflet.

Other findings:

Normal pulmonary vein drainage into the left atrium.

Normal left atrial appendage without a thrombus.

Normal size of the pulmonary artery.

Normal appearance of the pericardium.

Step artifact is present, but images are interpretable.
IMPRESSION: 1. At least mild nonobstructive CAD, CADRADS = 2. Cannot exclude
flow limiting stenosis based on available image, so CT FFR will be
performed and reported separately.

2. Coronary calcium score of 432. This was 99th percentile for age
and sex matched control. Of note, prior calcium score 610,
discrepancy likely due to difference between scanners.

3. Normal coronary origin with right dominance.

There is significant step artifact, with focal areas of the LAD,
LCx, and RCA unable to be fully evaluated. The rest of the portions
of the vessels are interpretable, but cannot evaluate for stenosis
in the artifact-impacted sections.

INTERPRETATION:

1. CAD-RADS 0: No evidence of CAD (0%). Consider non-atherosclerotic
causes of chest pain.

2. CAD-RADS 1: Minimal non-obstructive CAD (0-24%). Consider
non-atherosclerotic causes of chest pain. Consider preventive
therapy and risk factor modification.

3. CAD-RADS 2: Mild non-obstructive CAD (25-49%). Consider
non-atherosclerotic causes of chest pain. Consider preventive
therapy and risk factor modification.

4. CAD-RADS 3: Moderate stenosis (50-69%). Consider symptom-guided
anti-ischemic pharmacotherapy as well as risk factor modification
per guideline directed care. Additional analysis with CT FFR will be
submitted.

5. CAD-RADS 4: Severe stenosis. (70-99% or > 50% left main). Cardiac
catheterization or CT FFR is recommended. Consider symptom-guided
anti-ischemic pharmacotherapy as well as risk factor modification
per guideline directed care. Invasive coronary angiography
recommended with revascularization per published guideline
statements.

6. CAD-RADS 5: Total coronary occlusion (100%). Consider cardiac
catheterization or viability assessment. Consider symptom-guided
anti-ischemic pharmacotherapy as well as risk factor modification
per guideline directed care.

7. CAD-RADS N: Non-diagnostic study. Obstructive CAD can't be
excluded. Alternative evaluation is recommended.

*** End of Addendum ***
EXAM:
OVER-READ INTERPRETATION  CT CHEST

The following report is an over-read performed by radiologist Dr.
Gardenia Nordstrom [[email or web address]] on 07/03/2021. This over-read
does not include interpretation of cardiac or coronary anatomy or
pathology. The coronary CTA interpretation by the cardiologist is
attached.
FINDINGS: Vascular: Aortic atherosclerosis. Pulmonary arteries not well
evaluated secondary to bolus timing.

Mediastinum/Nodes: No imaged thoracic adenopathy. Esophageal fluid
level is subtle on [DATE].

Lungs/Pleura: No pleural fluid.  Mild centrilobular emphysema.

Minimal motion degradation inferiorly.

Left lower lobe scarring.

Upper Abdomen: Normal imaged portions of the liver, spleen, stomach.

Musculoskeletal: No acute osseous abnormality.
IMPRESSION: 1.  No acute findings in the imaged extracardiac chest.
2.  Aortic Atherosclerosis (O94W1-4I5.5).  Emphysema (O94W1-S92.1).
3. Esophageal air fluid level suggests dysmotility or
gastroesophageal reflux.

## 2021-09-14 ENCOUNTER — Other Ambulatory Visit: Payer: Self-pay

## 2021-09-14 ENCOUNTER — Ambulatory Visit (INDEPENDENT_AMBULATORY_CARE_PROVIDER_SITE_OTHER): Payer: Self-pay | Admitting: Pharmacist Clinician (PhC)/ Clinical Pharmacy Specialist

## 2021-09-14 DIAGNOSIS — E785 Hyperlipidemia, unspecified: Secondary | ICD-10-CM | POA: Insufficient documentation

## 2021-09-14 DIAGNOSIS — E782 Mixed hyperlipidemia: Secondary | ICD-10-CM

## 2021-09-14 NOTE — Assessment & Plan Note (Signed)
Patient with mixed hyperlipidemia, LDL at goal with rosuvastatin 20 mg.  Triglycerides still elevated, most recently at 203.  Bigger concern is for Lp(a), which is at 234.2.  Unfortunately he does not qualify for research, as he is currently primary prevention.  Discussed option of using medications to further lower triglycerides, however with the changes he has made over the past year in regards to lifestyle modifications, he will continue with dietary changes and see if this comes down further on its own.  He is aware that should he have a coronary event, we would then have a chance to enroll him in Lp(a) research.  Answered all patient questions.

## 2021-09-14 NOTE — Patient Instructions (Signed)
Your Results:             Your most recent labs Goal  Total Cholesterol 132 < 200  Triglycerides 203 < 150  HDL (happy/good cholesterol) 40 > 40  LDL (lousy/bad cholesterol 59 < 70      Medication changes:  No changes to medications at this time  Thank you for choosing [facility]

## 2021-09-14 NOTE — Progress Notes (Signed)
09/14/2021 Trellis Moment 1971-02-14 [id number]   HPI:  Larry Barber is a 50 y.o. male patient of Dr Servando Salina, who presents today for a lipid clinic evaluation.  See pertinent past medical history below.  Past Medical History: ASCVD Coronary calcium score 610 (99 th percentile)  hypertension Controlled on amlodipine 5 mg, losartan 100 mg daily    Current Medications:rosuvastatin 20 mg qd  Cholesterol Goals: LDL < 70  Family history: pgf died MI in early 3's, father with CABG at 53, complications from this, died at 62; one brother on asa, other brother with unhealthy lifestyle  Social: quit smoking 1 year ago, quit alcohol at same time  Diet: has switched to mostly Mediterranean diet, only avocado or olive oils, plenty of vegetables;   Exercise:  golf, home gym, stays active most of the day  Labs: 10/22: TC 132, TG 203, HDL 40, LDL 59, Lp(a) 234.2    Current Outpatient Medications  Medication Sig Dispense Refill   allopurinol (ZYLOPRIM) 100 MG tablet Take 100 mg by mouth 2 (two) times daily.     ALPRAZolam (XANAX) 0.5 MG tablet Take 0.5 mg by mouth at bedtime as needed for sleep.     amLODipine (NORVASC) 5 MG tablet Take 5 mg by mouth daily.     aspirin 81 MG chewable tablet Chew 1 tablet by mouth daily.     Coenzyme Q10 (COQ10 PO) Take 300 mg by mouth daily.     esomeprazole (NEXIUM) 20 MG capsule Take 20 mg by mouth daily as needed (heart burn).     ferrous sulfate 220 (44 Fe) MG/5ML solution Take 10 mLs by mouth daily.     GARLIC PO Take 40 mg by mouth daily.     losartan (COZAAR) 100 MG tablet Take 100 mg by mouth daily.     Melatonin 3 MG SUBL Take 3 mg by mouth at bedtime.     Menaquinone-7 (VITAMIN K2 PO) Take 160 mg by mouth 2 (two) times daily.     Multiple Vitamins-Minerals (PHYTOMULTI PO) Take 1 tablet by mouth daily.     nitroGLYCERIN (NITROSTAT) 0.4 MG SL tablet Place 1 tablet (0.4 mg total) under the tongue every 5 (five) minutes as needed for chest pain. Up to  three times. 25 tablet 3   OVER THE COUNTER MEDICATION Take 1 tablet by mouth daily. Blood Sugar Omega     predniSONE (DELTASONE) 5 MG tablet Take 1 tablet by mouth as needed.     rosuvastatin (CRESTOR) 20 MG tablet Take 1 tablet by mouth daily.     tamsulosin (FLOMAX) 0.4 MG CAPS capsule Take 0.4 mg by mouth daily as needed (urinary difficulty).     TURMERIC PO Take 200 mg by mouth daily.     vitamin C (ASCORBIC ACID) 500 MG tablet Take 500 mg by mouth 3 (three) times daily.     No current facility-administered medications for this visit.    Allergies  Allergen Reactions   Clindamycin/Lincomycin Other (See Comments)    Other reaction(s): Other [place]    Chantix [Varenicline]     Other reaction(s): "made me crazy"   Naproxen     Other reaction(s): nausea   Zoloft [Sertraline] Nausea And Vomiting    Past Medical History:  Diagnosis Date   GERD (gastroesophageal reflux disease)     Blood pressure 112/60, pulse 95, resp. rate 14, height 6\' 2"  (1.88 m), weight 213 lb 9.6 oz (96.9 kg), SpO2 95 %.   Hyperlipidemia Patient with mixed hyperlipidemia,  LDL at goal with rosuvastatin 20 mg.  Triglycerides still elevated, most recently at 203.  Bigger concern is for Lp(a), which is at 234.2.  Unfortunately he does not qualify for research, as he is currently primary prevention.  Discussed option of using medications to further lower triglycerides, however with the changes he has made over the past year in regards to lifestyle modifications, he will continue with dietary changes and see if this comes down further on its own.  He is aware that should he have a coronary event, we would then have a chance to enroll him in Lp(a) research.  Answered all patient questions.     Phillips Hay PharmD CPP [facility] [address] [place], [place] [address] [phone]

## 2021-11-04 ENCOUNTER — Encounter: Payer: Self-pay | Admitting: Cardiology

## 2021-11-04 ENCOUNTER — Other Ambulatory Visit: Payer: Self-pay

## 2021-11-04 ENCOUNTER — Ambulatory Visit (INDEPENDENT_AMBULATORY_CARE_PROVIDER_SITE_OTHER): Payer: Self-pay | Admitting: Cardiology

## 2021-11-04 VITALS — BP 138/84 | HR 93 | Ht 74.0 in | Wt 212.4 lb

## 2021-11-04 DIAGNOSIS — I2510 Atherosclerotic heart disease of native coronary artery without angina pectoris: Secondary | ICD-10-CM

## 2021-11-04 DIAGNOSIS — R002 Palpitations: Secondary | ICD-10-CM

## 2021-11-04 DIAGNOSIS — I10 Essential (primary) hypertension: Secondary | ICD-10-CM

## 2021-11-04 DIAGNOSIS — E785 Hyperlipidemia, unspecified: Secondary | ICD-10-CM

## 2021-11-04 MED ORDER — PROPRANOLOL HCL 10 MG PO TABS: 10.0000 mg | ORAL_TABLET | Freq: Three times a day (TID) | ORAL | 3 refills | Status: DC

## 2021-11-04 NOTE — Patient Instructions (Signed)
Medication Instructions:  Your physician has recommended you make the following change in your medication:  START: Propranolol 10 mg every 8 hjours as needed for heart rate greater than 130 bpm *If you need a refill on your cardiac medications before your next appointment, please call your pharmacy*   Lab Work: None If you have labs (blood work) drawn today and your tests are completely normal, you will receive your results only by: MyChart Message (if you have MyChart) OR A paper copy in the mail If you have any lab test that is abnormal or we need to change your treatment, we will call you to review the results.   Testing/Procedures: None   Follow-Up: At [facility], you and your health needs are our priority.  As part of our continuing mission to provide you with exceptional heart care, we have created designated Provider Care Teams.  These Care Teams include your primary Cardiologist (physician) and Advanced Practice Providers (APPs -  Physician Assistants and Nurse Practitioners) who all work together to provide you with the care you need, when you need it.  We recommend signing up for the patient portal called "MyChart".  Sign up information is provided on this After Visit Summary.  MyChart is used to connect with patients for Virtual Visits (Telemedicine).  Patients are able to view lab/test results, encounter notes, upcoming appointments, etc.  Non-urgent messages can be sent to your provider as well.   To learn more about what you can do with MyChart, go to [email or web address].    Your next appointment:   1 year(s)  The format for your next appointment:   In Person  Provider:   Thomasene Ripple, DO     Other Instructions

## 2021-11-04 NOTE — Progress Notes (Signed)
Cardiology Office Note:    Date:  11/04/2021   ID:  Larry Barber, DOB 01/18/1971, MRN [id number]  PCP:  Merri Brunette, MD  Cardiologist:  Thomasene Ripple, DO  Electrophysiologist:  None   Referring MD: Merri Brunette, MD   " I am doing fine"  History of Present Illness:    Larry Barber is a 50 y.o. male with a hx of   coronary artery disease which was appreciated on coronary CTA, hyperlipidemia, hypertension is here today for follow-up visit.  First saw the patient in June 26, 2021 at that time he had had a coronary CTA which show significant burden of calcium.  Referral sent the patient for coronary CT scan given his abnormal EKG.   I saw patient on August 2020 visit we talked about the results of his coronary CTA.   Today he tells me that he has been experiencing intermittent palpitations.  He recently was stopped with his Paxil.  He notes that for about several weeks now he has had episodes of palpitation which his heart rate was at 120-130 bpm  and last for 4 hours at that time.  He feels as if he is getting out of his skin.     Past Medical History:  Diagnosis Date   GERD (gastroesophageal reflux disease)     Past Surgical History:  Procedure Laterality Date   NO PAST SURGERIES      Current Medications: Current Meds  Medication Sig   allopurinol (ZYLOPRIM) 100 MG tablet Take 100 mg by mouth 2 (two) times daily.   ALPRAZolam (XANAX) 0.5 MG tablet Take 0.5 mg by mouth at bedtime as needed for sleep.   amLODipine (NORVASC) 5 MG tablet Take 5 mg by mouth daily.   aspirin 81 MG chewable tablet Chew 1 tablet by mouth daily.   Coenzyme Q10 (COQ10 PO) Take 300 mg by mouth daily.   esomeprazole (NEXIUM) 20 MG capsule Take 20 mg by mouth daily as needed (heart burn).   ferrous sulfate 220 (44 Fe) MG/5ML solution Take 10 mLs by mouth daily.   GARLIC PO Take 40 mg by mouth daily.   losartan (COZAAR) 100 MG tablet Take 100 mg by mouth daily.   Melatonin 3 MG SUBL Take 3 mg by  mouth at bedtime.   Menaquinone-7 (VITAMIN K2 PO) Take 160 mg by mouth 2 (two) times daily.   Multiple Vitamins-Minerals (PHYTOMULTI PO) Take 1 tablet by mouth daily.   nitroGLYCERIN (NITROSTAT) 0.4 MG SL tablet Place 1 tablet (0.4 mg total) under the tongue every 5 (five) minutes as needed for chest pain. Up to three times.   OVER THE COUNTER MEDICATION Take 1 tablet by mouth daily. Blood Sugar Omega   predniSONE (DELTASONE) 5 MG tablet Take 1 tablet by mouth as needed.   propranolol (INDERAL) 10 MG tablet Take 1 tablet (10 mg total) by mouth every 8 (eight) hours.   rosuvastatin (CRESTOR) 20 MG tablet Take 1 tablet by mouth daily.   tamsulosin (FLOMAX) 0.4 MG CAPS capsule Take 0.4 mg by mouth daily as needed (urinary difficulty).   TURMERIC PO Take 200 mg by mouth daily.   vitamin C (ASCORBIC ACID) 500 MG tablet Take 500 mg by mouth 3 (three) times daily.     Allergies:   Clindamycin/lincomycin, Chantix [varenicline], Naproxen, and Zoloft [sertraline]   Social History   Socioeconomic History   Marital status: Single    Spouse name: Not on file   Number of children: Not on file  Years of education: Not on file   Highest education level: Not on file  Occupational History   Not on file  Tobacco Use   Smoking status: Former    Types: Cigarettes   Smokeless tobacco: Current    Types: Chew  Substance and Sexual Activity   Alcohol use: Never   Drug use: Never   Sexual activity: Not on file  Other Topics Concern   Not on file  Social History Narrative   Not on file   Social Determinants of Health   Financial Resource Strain: Not on file  Food Insecurity: Not on file  Transportation Needs: Not on file  Physical Activity: Not on file  Stress: Not on file  Social Connections: Not on file     Family History: The patient's family history includes Allergies in his half-brother; Heart disease in his father and paternal grandfather; Other in his mother.  ROS:   Review of  Systems  Constitution: Negative for decreased appetite, fever and weight gain.  HENT: Negative for congestion, ear discharge, hoarse voice and sore throat.   Eyes: Negative for discharge, redness, vision loss in right eye and visual halos.  Cardiovascular: Negative for chest pain, dyspnea on exertion, leg swelling, orthopnea and palpitations.  Respiratory: Negative for cough, hemoptysis, shortness of breath and snoring.   Endocrine: Negative for heat intolerance and polyphagia.  Hematologic/Lymphatic: Negative for bleeding problem. Does not bruise/bleed easily.  Skin: Negative for flushing, nail changes, rash and suspicious lesions.  Musculoskeletal: Negative for arthritis, joint pain, muscle cramps, myalgias, neck pain and stiffness.  Gastrointestinal: Negative for abdominal pain, bowel incontinence, diarrhea and excessive appetite.  Genitourinary: Negative for decreased libido, genital sores and incomplete emptying.  Neurological: Negative for brief paralysis, focal weakness, headaches and loss of balance.  Psychiatric/Behavioral: Negative for altered mental status, depression and suicidal ideas.  Allergic/Immunologic: Negative for HIV exposure and persistent infections.    EKGs/Labs/Other Studies Reviewed:    The following studies were reviewed today:   EKG:  None today   CCTA 07/03/2021 FINDINGS: Coronary calcium score: The patient's coronary artery calcium score is 432, which places the patient in the 99th percentile.   Coronary arteries: Normal coronary origins.  Right dominance.   Right Coronary Artery: Normal caliber vessel, gives rise to PDA. There is focal mixed calcified and noncalcified plaque in the proximal RCA, with 25-49% stenosis. There is focal mixed calcified and noncalcified plaque in the mid RCA, with 25-49% stenosis. There is mixed calcified and noncalcified plaque in the distal RCA with 1-24% stenosis.   Left Main Coronary Artery: Normal caliber vessel. No  significant plaque or stenosis.   Left Anterior Descending Coronary Artery: Normal caliber vessel. Diffused mixed calcified and noncalcified plaque. There is a focal mixed plaque at the origin of the LAD, with 1-24% stenosis. There is a focal calcified plaque in the mid LAD with 25-49% stenosis. Gives rise to 2 diagonal branches.   Left Circumflex Artery: Normal caliber vessel. There is scattered noncalcified plaque without significant stenosis. Gives rise to 2 OM branches.   Aorta: Normal size, 30 mm at the mid ascending aorta (level of the PA bifurcation) measured double oblique. Aortic atherosclerosis. No dissection seen in visualized portions of the aorta.   Aortic Valve: No calcifications. Trileaflet.   Other findings:   Normal pulmonary vein drainage into the left atrium.   Normal left atrial appendage without a thrombus.   Normal size of the pulmonary artery.   Normal appearance of the pericardium.  Step artifact is present, but images are interpretable.   IMPRESSION: 1. At least mild nonobstructive CAD, CADRADS = 2. Cannot exclude flow limiting stenosis based on available image, so CT FFR will be performed and reported separately.   2. Coronary calcium score of 432. This was 99th percentile for age and sex matched control. Of note, prior calcium score 610, discrepancy likely due to difference between scanners.   3. Normal coronary origin with right dominance.   There is significant step artifact, with focal areas of the LAD, LCx, and RCA unable to be fully evaluated. The rest of the portions of the vessels are interpretable, but cannot evaluate for stenosis in the artifact-impacted sections.  Recent Labs: 07/02/2021: BUN 15; Creatinine, Ser 0.97; Magnesium 1.8; Potassium 3.9; Sodium 137  Recent Lipid Panel    Component Value Date/Time   CHOL 132 08/18/2021 0807   TRIG 203 (H) 08/18/2021 0807   HDL 40 08/18/2021 0807   CHOLHDL 3.3 08/18/2021 0807    LDLCALC 59 08/18/2021 0807    Physical Exam:    VS:  BP 138/84    Pulse 93    Ht 6\' 2"  (1.88 m)    Wt 212 lb 6.4 oz (96.3 kg)    SpO2 99%    BMI 27.27 kg/m     Wt Readings from Last 3 Encounters:  11/04/21 212 lb 6.4 oz (96.3 kg)  09/14/21 213 lb 9.6 oz (96.9 kg)  07/07/21 212 lb (96.2 kg)     GEN: Well nourished, well developed in no acute distress HEENT: Normal NECK: No JVD; No carotid bruits LYMPHATICS: No lymphadenopathy CARDIAC: S1S2 noted,RRR, no murmurs, rubs, gallops RESPIRATORY:  Clear to auscultation without rales, wheezing or rhonchi  ABDOMEN: Soft, non-tender, non-distended, +bowel sounds, no guarding. EXTREMITIES: No edema, No cyanosis, no clubbing MUSCULOSKELETAL:  No deformity  SKIN: Warm and dry NEUROLOGIC:  Alert and oriented x 3, non-focal PSYCHIATRIC:  Normal affect, good insight  ASSESSMENT:    1. Coronary artery disease involving native coronary artery of native heart, unspecified whether angina present   2. Hyperlipidemia, unspecified hyperlipidemia type   3. Palpitations   4. Primary hypertension    PLAN:     His palpitations highly suspected to be related to his anxiety but the increased heart rate also does bother the patient.  We discussed using propranolol 10 mg every 8 hours as needed. He is agreeable to try this.  Educated patient on how to use this medication.  In terms of his coronary artery disease no anginal symptoms.  Continue his aspirin and statin.  Blood pressure is acceptable, continue with current antihypertensive regimen.  The patient is in agreement with the above plan. The patient left the office in stable condition.  The patient will follow up in 1 year or sooner if needed.   Medication Adjustments/Labs and Tests Ordered: Current medicines are reviewed at length with the patient today.  Concerns regarding medicines are outlined above.  No orders of the defined types were placed in this encounter.  Meds ordered this encounter   Medications   propranolol (INDERAL) 10 MG tablet    Sig: Take 1 tablet (10 mg total) by mouth every 8 (eight) hours.    Dispense:  270 tablet    Refill:  3    Patient Instructions  Medication Instructions:  Your physician has recommended you make the following change in your medication:  START: Propranolol 10 mg every 8 hjours as needed for heart rate greater than 130 bpm *  If you need a refill on your cardiac medications before your next appointment, please call your pharmacy*   Lab Work: None If you have labs (blood work) drawn today and your tests are completely normal, you will receive your results only by: MyChart Message (if you have MyChart) OR A paper copy in the mail If you have any lab test that is abnormal or we need to change your treatment, we will call you to review the results.   Testing/Procedures: None   Follow-Up: At [facility], you and your health needs are our priority.  As part of our continuing mission to provide you with exceptional heart care, we have created designated Provider Care Teams.  These Care Teams include your primary Cardiologist (physician) and Advanced Practice Providers (APPs -  Physician Assistants and Nurse Practitioners) who all work together to provide you with the care you need, when you need it.  We recommend signing up for the patient portal called "MyChart".  Sign up information is provided on this After Visit Summary.  MyChart is used to connect with patients for Virtual Visits (Telemedicine).  Patients are able to view lab/test results, encounter notes, upcoming appointments, etc.  Non-urgent messages can be sent to your provider as well.   To learn more about what you can do with MyChart, go to [email or web address].    Your next appointment:   1 year(s)  The format for your next appointment:   In Person  Provider:   Thomasene Ripple, DO     Other Instructions     Adopting a Healthy Lifestyle.  Know what a healthy  weight is for you (roughly BMI <25) and aim to maintain this   Aim for 7+ servings of fruits and vegetables daily   65-80+ fluid ounces of water or unsweet tea for healthy kidneys   Limit to max 1 drink of alcohol per day; avoid smoking/tobacco   Limit animal fats in diet for cholesterol and heart health - choose grass fed whenever available   Avoid highly processed foods, and foods high in saturated/trans fats   Aim for low stress - take time to unwind and care for your mental health   Aim for 150 min of moderate intensity exercise weekly for heart health, and weights twice weekly for bone health   Aim for 7-9 hours of sleep daily   When it comes to diets, agreement about the perfect plan isnt easy to find, even among the experts. Experts at the [facility] [employer] developed an idea known as the Healthy Eating Plate. Just imagine a plate divided into logical, healthy portions.   The emphasis is on diet quality:   Load up on vegetables and fruits - one-half of your plate: Aim for color and variety, and remember that potatoes dont count.   Go for whole grains - one-quarter of your plate: Whole wheat, barley, wheat berries, quinoa, oats, brown rice, and foods made with them. If you want pasta, go with whole wheat pasta.   Protein power - one-quarter of your plate: Fish, chicken, beans, and nuts are all healthy, versatile protein sources. Limit red meat.   The diet, however, does go beyond the plate, offering a few other suggestions.   Use healthy plant oils, such as olive, canola, soy, corn, sunflower and peanut. Check the labels, and avoid partially hydrogenated oil, which have unhealthy trans fats.   If youre thirsty, drink water. Coffee and tea are good in moderation, but skip sugary drinks and  limit milk and dairy products to one or two daily servings.   The type of carbohydrate in the diet is more important than the amount. Some sources of carbohydrates, such as  vegetables, fruits, whole grains, and beans-are healthier than others.   Finally, stay active  Signed, Thomasene Ripple, DO  11/04/2021 10:56 AM    [facility]

## 2022-08-12 ENCOUNTER — Encounter: Payer: Self-pay | Admitting: Cardiology

## 2022-08-12 ENCOUNTER — Ambulatory Visit: Payer: Self-pay | Attending: Cardiology | Admitting: Cardiology

## 2022-08-12 VITALS — BP 130/86 | HR 67 | Ht 74.0 in | Wt 215.8 lb

## 2022-08-12 DIAGNOSIS — Z0000 Encounter for general adult medical examination without abnormal findings: Secondary | ICD-10-CM

## 2022-08-12 DIAGNOSIS — I2510 Atherosclerotic heart disease of native coronary artery without angina pectoris: Secondary | ICD-10-CM | POA: Insufficient documentation

## 2022-08-12 DIAGNOSIS — E785 Hyperlipidemia, unspecified: Secondary | ICD-10-CM

## 2022-08-12 NOTE — Progress Notes (Signed)
Cardiology Office Note:    Date:  08/12/2022   ID:  Larry Barber, DOB [birth year], MRN [id number]  PCP:  Deland Pretty, MD  Cardiologist:  Berniece Salines, DO  Electrophysiologist:  None   Referring MD: Deland Pretty, MD   " I am doing fine"  History of Present Illness:    Larry Barber is a 51 y.o. male with a hx of   coronary artery disease which was appreciated on coronary CTA, hyperlipidemia, hypertension is here today for follow-up visit.  First saw the patient in June 26, 2021 at that time he had had a coronary CTA which show significant burden of calcium.  Referral sent the patient for coronary CT scan given his abnormal EKG.   I saw patient on August 2020 visit we talked about the results of his coronary CTA.   I saw the patient on November 04, 2021 at that time he was experiencing palpitations.  We discussed the use of propanolol 10 mg every 8 hours as needed.  He is here today for follow-up visit.  He tells me that overall he is doing well.  He has stopped the Crestor because he has significant memory issues on his medications.  He also recently has been experiencing some palpitations but not much he says.  He had not tried the propanolol.   Past Medical History:  Diagnosis Date   GERD (gastroesophageal reflux disease)     Past Surgical History:  Procedure Laterality Date   NO PAST SURGERIES      Current Medications: Current Meds  Medication Sig   allopurinol (ZYLOPRIM) 300 MG tablet Take 300 mg by mouth daily.   ALPRAZolam (XANAX) 0.5 MG tablet Take 0.5 mg by mouth at bedtime as needed for sleep.   esomeprazole (NEXIUM) 20 MG capsule Take 20 mg by mouth daily as needed (heart burn).   losartan (COZAAR) 100 MG tablet Take 100 mg by mouth daily.   Melatonin 3 MG SUBL Take 3 mg by mouth at bedtime.   Multiple Vitamins-Minerals (PHYTOMULTI PO) Take 1 tablet by mouth daily.   nitroGLYCERIN (NITROSTAT) 0.4 MG SL tablet Place 1 tablet (0.4 mg total) under the tongue  every 5 (five) minutes as needed for chest pain. Up to three times.   tamsulosin (FLOMAX) 0.4 MG CAPS capsule Take 0.4 mg by mouth daily as needed (urinary difficulty).   TURMERIC PO Take 200 mg by mouth daily.   valACYclovir (VALTREX) 1000 MG tablet      Allergies:   Clindamycin/lincomycin, Chantix [varenicline], Naproxen, and Zoloft [sertraline]   Social History   Socioeconomic History   Marital status: Single    Spouse name: Not on file   Number of children: Not on file   Years of education: Not on file   Highest education level: Not on file  Occupational History   Not on file  Tobacco Use   Smoking status: Former    Types: Cigarettes   Smokeless tobacco: Current    Types: Chew  Substance and Sexual Activity   Alcohol use: Never   Drug use: Never   Sexual activity: Not on file  Other Topics Concern   Not on file  Social History Narrative   Not on file   Social Determinants of Health   Financial Resource Strain: Not on file  Food Insecurity: Not on file  Transportation Needs: Not on file  Physical Activity: Not on file  Stress: Not on file  Social Connections: Not on file     Family  History: The patient's family history includes Allergies in his half-brother; Heart disease in his father and paternal grandfather; Other in his mother.  ROS:   Review of Systems  Constitution: Negative for decreased appetite, fever and weight gain.  HENT: Negative for congestion, ear discharge, hoarse voice and sore throat.   Eyes: Negative for discharge, redness, vision loss in right eye and visual halos.  Cardiovascular: Negative for chest pain, dyspnea on exertion, leg swelling, orthopnea and palpitations.  Respiratory: Negative for cough, hemoptysis, shortness of breath and snoring.   Endocrine: Negative for heat intolerance and polyphagia.  Hematologic/Lymphatic: Negative for bleeding problem. Does not bruise/bleed easily.  Skin: Negative for flushing, nail changes, rash and  suspicious lesions.  Musculoskeletal: Negative for arthritis, joint pain, muscle cramps, myalgias, neck pain and stiffness.  Gastrointestinal: Negative for abdominal pain, bowel incontinence, diarrhea and excessive appetite.  Genitourinary: Negative for decreased libido, genital sores and incomplete emptying.  Neurological: Negative for brief paralysis, focal weakness, headaches and loss of balance.  Psychiatric/Behavioral: Negative for altered mental status, depression and suicidal ideas.  Allergic/Immunologic: Negative for HIV exposure and persistent infections.    EKGs/Labs/Other Studies Reviewed:    The following studies were reviewed today:   EKG: Sinus rhythm, heart rate 67 beats a minute compared to prior EKG no significant change.  CCTA 07/03/2021 FINDINGS: Coronary calcium score: The patient's coronary artery calcium score is 432, which places the patient in the 99th percentile.   Coronary arteries: Normal coronary origins.  Right dominance.   Right Coronary Artery: Normal caliber vessel, gives rise to PDA. There is focal mixed calcified and noncalcified plaque in the proximal RCA, with 25-49% stenosis. There is focal mixed calcified and noncalcified plaque in the mid RCA, with 25-49% stenosis. There is mixed calcified and noncalcified plaque in the distal RCA with 1-24% stenosis.   Left Main Coronary Artery: Normal caliber vessel. No significant plaque or stenosis.   Left Anterior Descending Coronary Artery: Normal caliber vessel. Diffused mixed calcified and noncalcified plaque. There is a focal mixed plaque at the origin of the LAD, with 1-24% stenosis. There is a focal calcified plaque in the mid LAD with 25-49% stenosis. Gives rise to 2 diagonal branches.   Left Circumflex Artery: Normal caliber vessel. There is scattered noncalcified plaque without significant stenosis. Gives rise to 2 OM branches.   Aorta: Normal size, 30 mm at the mid ascending aorta (level of the PA  bifurcation) measured double oblique. Aortic atherosclerosis. No dissection seen in visualized portions of the aorta.   Aortic Valve: No calcifications. Trileaflet.   Other findings:   Normal pulmonary vein drainage into the left atrium.   Normal left atrial appendage without a thrombus.   Normal size of the pulmonary artery.   Normal appearance of the pericardium.   Step artifact is present, but images are interpretable.   IMPRESSION: 1. At least mild nonobstructive CAD, CADRADS = 2. Cannot exclude flow limiting stenosis based on available image, so CT FFR will be performed and reported separately.   2. Coronary calcium score of 432. This was 99th percentile for age and sex matched control. Of note, prior calcium score 610, discrepancy likely due to difference between scanners.   3. Normal coronary origin with right dominance.   There is significant step artifact, with focal areas of the LAD, LCx, and RCA unable to be fully evaluated. The rest of the portions of the vessels are interpretable, but cannot evaluate for stenosis in the artifact-impacted sections.  Recent Labs:  No results found for requested labs within last 365 days.  Recent Lipid Panel    Component Value Date/Time   CHOL 132 08/18/2021 0807   TRIG 203 (H) 08/18/2021 0807   HDL 40 08/18/2021 0807   CHOLHDL 3.3 08/18/2021 0807   LDLCALC 59 08/18/2021 0807    Physical Exam:    VS:  BP 130/86   Pulse 67   Ht 6\' 2"  (1.88 m)   Wt 215 lb 12.8 oz (97.9 kg)   SpO2 97%   BMI 27.71 kg/m     Wt Readings from Last 3 Encounters:  08/12/22 215 lb 12.8 oz (97.9 kg)  11/04/21 212 lb 6.4 oz (96.3 kg)  09/14/21 213 lb 9.6 oz (96.9 kg)     GEN: Well nourished, well developed in no acute distress HEENT: Normal NECK: No JVD; No carotid bruits LYMPHATICS: No lymphadenopathy CARDIAC: S1S2 noted,RRR, no murmurs, rubs, gallops RESPIRATORY:  Clear to auscultation without rales, wheezing or rhonchi  ABDOMEN: Soft,  non-tender, non-distended, +bowel sounds, no guarding. EXTREMITIES: No edema, No cyanosis, no clubbing MUSCULOSKELETAL:  No deformity  SKIN: Warm and dry NEUROLOGIC:  Alert and oriented x 3, non-focal PSYCHIATRIC:  Normal affect, good insight  ASSESSMENT:    1. Hyperlipidemia, unspecified hyperlipidemia type   2. Routine health maintenance   3. Coronary artery disease involving native coronary artery of native heart without angina pectoris     PLAN:    He still is experiencing some palpitations.  He at this point had not tried the propanolol.  But prefer to go on a nonmedicinal route.  Therefore we have discussed he will get the cardia mobile we will get EKG recording and send me this information.  Coronary artery disease-no angina.  He is off aspirin and statin.  We will get lipid profile today.  We discussed if need we will choose another lipid-lowering agent that will not be Crestor.  Blood pressure is acceptable, will continue to monitor.  The patient is in agreement with the above plan. The patient left the office in stable condition.  The patient will follow up in 1 year or sooner if needed.   Medication Adjustments/Labs and Tests Ordered: Current medicines are reviewed at length with the patient today.  Concerns regarding medicines are outlined above.  Orders Placed This Encounter  Procedures   Lipid panel   EKG 12-Lead   No orders of the defined types were placed in this encounter.   Patient Instructions  Medication Instructions:  Your physician recommends that you continue on your current medications as directed. Please refer to the Current Medication list given to you today.  *If you need a refill on your cardiac medications before your next appointment, please call your pharmacy*   Lab Work: TODAY: Lipids If you have labs (blood work) drawn today and your tests are completely normal, you will receive your results only by: MyChart Message (if you have MyChart)  OR A paper copy in the mail If you have any lab test that is abnormal or we need to change your treatment, we will call you to review the results.   Testing/Procedures: None   Follow-Up: At [facility], you and your health needs are our priority.  As part of our continuing mission to provide you with exceptional heart care, we have created designated Provider Care Teams.  These Care Teams include your primary Cardiologist (physician) and Advanced Practice Providers (APPs -  Physician Assistants and Nurse Practitioners) who all work together to  provide you with the care you need, when you need it.  We recommend signing up for the patient portal called "MyChart".  Sign up information is provided on this After Visit Summary.  MyChart is used to connect with patients for Virtual Visits (Telemedicine).  Patients are able to view lab/test results, encounter notes, upcoming appointments, etc.  Non-urgent messages can be sent to your provider as well.   To learn more about what you can do with MyChart, go to [email or web address].    Your next appointment:   1 year(s)  The format for your next appointment:   In Person  Provider:   Thomasene Ripple, DO     Other Instructions KardiaMobile [email or web address]        FDA-cleared, clinical grade mobile EKG monitor: Lourena Simmonds is the most clinically-validated mobile EKG used by the world's leading cardiac care medical professionals With Basic service, know instantly if your heart rhythm is normal or if atrial fibrillation is detected, and email the last single EKG recording to yourself or your doctor Premium service, available for purchase through the Kardia app for $9.99 per month or $99 per year, includes unlimited history and storage of your EKG recordings, a monthly EKG summary report to share with your doctor, along with the ability to track your blood pressure, activity and weight Includes one KardiaMobile  phone clip FREE SHIPPING: Standard delivery 1-3 business days. Orders placed by 11:00am PST will ship that afternoon. Otherwise, will ship next business day. All orders ship via [employer] from [place], [place]    [employer] - sending an EKG Download app and set up profile. Run EKG - by placing 1-2 fingers on the silver plates After EKG is complete - Download PDF  - Skip password (if you apply a password the provider will need it to view the EKG) Click share button (square with upward arrow) in bottom left corner To send: choose MyChart (first time log into MyChart)  Pop up window about sending ECG Click continue Choose type of message Choose provider Type subject and message Click send (EKG should be attached)  - To send additional EKGs in one message click the paperclip image and bottom of page to attach.     Important Information About Sugar         Adopting a Healthy Lifestyle.  Know what a healthy weight is for you (roughly BMI <25) and aim to maintain this   Aim for 7+ servings of fruits and vegetables daily   65-80+ fluid ounces of water or unsweet tea for healthy kidneys   Limit to max 1 drink of alcohol per day; avoid smoking/tobacco   Limit animal fats in diet for cholesterol and heart health - choose grass fed whenever available   Avoid highly processed foods, and foods high in saturated/trans fats   Aim for low stress - take time to unwind and care for your mental health   Aim for 150 min of moderate intensity exercise weekly for heart health, and weights twice weekly for bone health   Aim for 7-9 hours of sleep daily   When it comes to diets, agreement about the perfect plan isnt easy to find, even among the experts. Experts at the [facility] [employer] developed an idea known as the Healthy Eating Plate. Just imagine a plate divided into logical, healthy portions.   The emphasis is on diet quality:   Load up on vegetables and fruits -  one-half of your plate: Aim for  color and variety, and remember that potatoes dont count.   Go for whole grains - one-quarter of your plate: Whole wheat, barley, wheat berries, quinoa, oats, brown rice, and foods made with them. If you want pasta, go with whole wheat pasta.   Protein power - one-quarter of your plate: Fish, chicken, beans, and nuts are all healthy, versatile protein sources. Limit red meat.   The diet, however, does go beyond the plate, offering a few other suggestions.   Use healthy plant oils, such as olive, canola, soy, corn, sunflower and peanut. Check the labels, and avoid partially hydrogenated oil, which have unhealthy trans fats.   If youre thirsty, drink water. Coffee and tea are good in moderation, but skip sugary drinks and limit milk and dairy products to one or two daily servings.   The type of carbohydrate in the diet is more important than the amount. Some sources of carbohydrates, such as vegetables, fruits, whole grains, and beans-are healthier than others.   Finally, stay active  Signed, Thomasene Ripple, DO  08/12/2022 10:51 AM    [facility]

## 2022-08-12 NOTE — Patient Instructions (Addendum)
Medication Instructions:  Your physician recommends that you continue on your current medications as directed. Please refer to the Current Medication list given to you today.  *If you need a refill on your cardiac medications before your next appointment, please call your pharmacy*   Lab Work: TODAY: Lipids If you have labs (blood work) drawn today and your tests are completely normal, you will receive your results only by: [place] (if you have MyChart) OR A paper copy in the mail If you have any lab test that is abnormal or we need to change your treatment, we will call you to review the results.   Testing/Procedures: None   Follow-Up: At [facility], you and your health needs are our priority.  As part of our continuing mission to provide you with exceptional heart care, we have created designated Provider Care Teams.  These Care Teams include your primary Cardiologist (physician) and Advanced Practice Providers (APPs -  Physician Assistants and Nurse Practitioners) who all work together to provide you with the care you need, when you need it.  We recommend signing up for the patient portal called "MyChart".  Sign up information is provided on this After Visit Summary.  MyChart is used to connect with patients for Virtual Visits (Telemedicine).  Patients are able to view lab/test results, encounter notes, upcoming appointments, etc.  Non-urgent messages can be sent to your provider as well.   To learn more about what you can do with MyChart, go to [email or web address].    Your next appointment:   1 year(s)  The format for your next appointment:   In Person  Provider:   Berniece Salines, DO     Other Instructions KardiaMobile [email or web address]        FDA-cleared, clinical grade mobile EKG monitor: Larry Barber is the most clinically-validated mobile EKG used by the world's leading cardiac care medical professionals With Basic service,  know instantly if your heart rhythm is normal or if atrial fibrillation is detected, and email the last single EKG recording to yourself or your doctor Premium service, available for purchase through the Kardia app for $9.99 per month or $99 per year, includes unlimited history and storage of your EKG recordings, a monthly EKG summary report to share with your doctor, along with the ability to track your blood pressure, activity and weight Includes one KardiaMobile phone clip FREE SHIPPING: Standard delivery 1-3 business days. Orders placed by 11:00am PST will ship that afternoon. Otherwise, will ship next business day. All orders ship via [employer] from [place], [place] - sending an EKG Download app and set up profile. Run EKG - by placing 1-2 fingers on the silver plates After EKG is complete - Download PDF  - Skip password (if you apply a password the provider will need it to view the EKG) Click share button (square with upward arrow) in bottom left corner To send: choose MyChart (first time log into MyChart)  Pop up window about sending ECG Click continue Choose type of message Choose provider Type subject and message Click send (EKG should be attached)  - To send additional EKGs in one message click the paperclip image and bottom of page to attach.     Important Information About Sugar

## 2022-08-13 ENCOUNTER — Encounter: Payer: Self-pay | Admitting: Cardiology

## 2022-08-13 ENCOUNTER — Telehealth: Payer: Self-pay | Admitting: Licensed Clinical Social Worker

## 2022-08-13 DIAGNOSIS — E782 Mixed hyperlipidemia: Secondary | ICD-10-CM

## 2022-08-13 LAB — LIPID PANEL
Chol/HDL Ratio: 7.2 ratio — ABNORMAL HIGH (ref 0.0–5.0)
Cholesterol, Total: 231 mg/dL — ABNORMAL HIGH (ref 100–199)
HDL: 32 mg/dL — ABNORMAL LOW (ref >39)
LDL Chol Calc (NIH): 117 mg/dL — ABNORMAL HIGH (ref 0–99)
Triglycerides: 464 mg/dL — ABNORMAL HIGH (ref 0–149)
VLDL Cholesterol Cal: 82 mg/dL — ABNORMAL HIGH (ref 5–40)

## 2022-08-13 MED ORDER — ATORVASTATIN CALCIUM 20 MG PO TABS: 20.0000 mg | ORAL_TABLET | Freq: Every day | ORAL | 3 refills | Status: DC

## 2022-08-13 NOTE — Progress Notes (Signed)
Heart and Vascular Care Navigation  08/13/2022  Larry Barber 10/23/1971 [id number]  Reason for Referral:  Patient is participating in a Managed Medicaid Plan: No, self pay only.   Engaged with patient by telephone for initial visit for Heart and Vascular Care Coordination.                                                                                                   Assessment:       LCSW spoke with pt this morning at [phone] to f/u after appt w/ Dr. Harriet Masson at [facility]. Introduced self, role, reason for call. Pt confirmed home address, PCP, emergency contact remains his significant other. Pt owns his own business and priced out several insurance plans but they were all too expensive for him to be able to manage monthly payments. Pt shares he is not sure he would be eligible for assistance program through [facility] but is interested in at least applying to see. Pt shares no issues with current cost of living (housing, food, medications). May benefit from bringing GoodRx to [employer] when filling medications. Pt encouraged to f/u with me if needed moving forward.                                HRT/VAS Care Coordination     Patients Home Cardiology Office [place] Team Social Worker   Social Worker Name: Margarito Liner [place] [phone]   Living arrangements for the past 2 months Single Family Home   Patient Has Concern With [place] Yes   Patient Concerns With Medical Bills no insurance, self employed   Medical Bill Referrals: [facility] Financial Assistance   Does Patient Have Prescription Coverage? No   Patient Prescription Assistance Programs --  Good Rx       Social History:                                                                             SDOH Screenings   Food Insecurity: No Food Insecurity (08/13/2022)  Housing: Low Risk  (08/13/2022)  Transportation Needs: No Transportation Needs (08/13/2022)  Utilities:  Not At Risk (08/13/2022)  Financial Resource Strain: Low Risk  (08/13/2022)  Tobacco Use: High Risk (08/12/2022)    SDOH Interventions: Financial Resources:  Financial Strain Interventions: Other (Comment) (CAFA/Good Rx) Occupational hygienist for [employer]  Food Insecurity:  Food Insecurity Interventions: Intervention Not Indicated  Housing Insecurity:  Housing Interventions: Intervention Not Indicated  Transportation:   Transportation Interventions: Intervention Not Indicated    Other Care Navigation Interventions:     Provided Pharmacy assistance resources  (Good Rx)   Follow-up plan:   LCSW mailed pt the following: my card, [employer]   and GoodRx card. Encouraged pt to f/u with me if any additional questions/concerns.

## 2022-11-22 ENCOUNTER — Encounter: Payer: Self-pay | Admitting: Cardiology

## 2022-11-22 ENCOUNTER — Ambulatory Visit: Payer: Self-pay | Attending: Cardiology | Admitting: Cardiology

## 2022-11-22 VITALS — BP 122/70 | HR 87 | Ht 74.0 in | Wt 226.2 lb

## 2022-11-22 DIAGNOSIS — R0609 Other forms of dyspnea: Secondary | ICD-10-CM

## 2022-11-22 DIAGNOSIS — I2510 Atherosclerotic heart disease of native coronary artery without angina pectoris: Secondary | ICD-10-CM

## 2022-11-22 DIAGNOSIS — E782 Mixed hyperlipidemia: Secondary | ICD-10-CM

## 2022-11-22 DIAGNOSIS — Z79899 Other long term (current) drug therapy: Secondary | ICD-10-CM

## 2022-11-22 NOTE — Patient Instructions (Signed)
Medication Instructions:  Your physician recommends that you continue on your current medications as directed. Please refer to the Current Medication list given to you today.  *If you need a refill on your cardiac medications before your next appointment, please call your pharmacy*   Lab Work: Your physician recommends that you have the following labs drawn today: CMET, Magnesium, Lipids, CBC and TSH   If you have labs (blood work) drawn today and your tests are completely normal, you will receive your results only by: [place] (if you have MyChart) OR A paper copy in the mail If you have any lab test that is abnormal or we need to change your treatment, we will call you to review the results.   Testing/Procedures: Your physician has requested that you have an echocardiogram. Echocardiography is a painless test that uses sound waves to create images of your heart. It provides your doctor with information about the size and shape of your heart and how well your heart's chambers and valves are working. This procedure takes approximately one hour. There are no restrictions for this procedure. Please do NOT wear cologne, perfume, aftershave, or lotions (deodorant is allowed). Please arrive 15 minutes prior to your appointment time.    Follow-Up: At [facility], you and your health needs are our priority.  As part of our continuing mission to provide you with exceptional heart care, we have created designated Provider Care Teams.  These Care Teams include your primary Cardiologist (physician) and Advanced Practice Providers (APPs -  Physician Assistants and Nurse Practitioners) who all work together to provide you with the care you need, when you need it.  We recommend signing up for the patient portal called "MyChart".  Sign up information is provided on this After Visit Summary.  MyChart is used to connect with patients for Virtual Visits (Telemedicine).  Patients are able to  view lab/test results, encounter notes, upcoming appointments, etc.  Non-urgent messages can be sent to your provider as well.   To learn more about what you can do with MyChart, go to [email or web address].    Your next appointment:   12 week(s)  Provider:   Berniece Salines, DO

## 2022-11-22 NOTE — Progress Notes (Signed)
Cardiology Office Note:    Date:  11/22/2022   ID:  Larry Barber, DOB 01-09-1971, MRN [id number]  PCP:  Deland Pretty, MD  Cardiologist:  Berniece Salines, DO  Electrophysiologist:  None   Referring MD: Deland Pretty, MD   " I am doing fine"  History of Present Illness:    Larry Barber is a 52 y.o. male with a hx of   coronary artery disease which was appreciated on coronary CTA, hyperlipidemia, hypertension is here today for follow-up visit.  First saw the patient in June 26, 2021 at that time he had had a coronary CTA which show significant burden of calcium.  Referral sent the patient for coronary CT scan given his abnormal EKG.   I saw patient on August 2020 visit we talked about the results of his coronary CTA.   I saw the patient on November 04, 2021 at that time he was experiencing palpitations.  We discussed the use of propanolol 10 mg every 8 hours as needed.  At his visit on August 12, 2022 he had not been taking his propranolol or Crestor.  During that visit we talked about restarting his.  He was agreeable.  He is here today because he tells me he is experiencing intermittent shortness of breath.  On minimal exertion.  He is concerned about this.  No chest pain.   Past Medical History:  Diagnosis Date   GERD (gastroesophageal reflux disease)     Past Surgical History:  Procedure Laterality Date   NO PAST SURGERIES      Current Medications: Current Meds  Medication Sig   allopurinol (ZYLOPRIM) 300 MG tablet Take 300 mg by mouth daily.   ALPRAZolam (XANAX) 0.5 MG tablet Take 0.5 mg by mouth at bedtime as needed for sleep.   amLODipine (NORVASC) 5 MG tablet Take 5 mg by mouth daily.   aspirin 81 MG chewable tablet Chew 1 tablet by mouth daily.   Coenzyme Q10 (COQ10 PO) Take 300 mg by mouth daily.   EPINEPHrine 0.3 mg/0.3 mL IJ SOAJ injection Inject into the muscle.   esomeprazole (NEXIUM) 20 MG capsule Take 20 mg by mouth daily as needed (heart burn).    ferrous sulfate 220 (44 Fe) MG/5ML solution Take 10 mLs by mouth daily.   GARLIC PO Take 40 mg by mouth daily.   losartan (COZAAR) 100 MG tablet Take 100 mg by mouth daily.   Melatonin 3 MG SUBL Take 3 mg by mouth at bedtime.   Menaquinone-7 (VITAMIN K2 PO) Take 160 mg by mouth 2 (two) times daily.   Multiple Vitamins-Minerals (PHYTOMULTI PO) Take 1 tablet by mouth daily.   OVER THE COUNTER MEDICATION Take 1 tablet by mouth daily. Blood Sugar Omega   predniSONE (DELTASONE) 5 MG tablet Take 1 tablet by mouth as needed.   propranolol (INDERAL) 10 MG tablet Take 1 tablet (10 mg total) by mouth every 8 (eight) hours.   rosuvastatin (CRESTOR) 20 MG tablet Take 20 mg by mouth daily.   tamsulosin (FLOMAX) 0.4 MG CAPS capsule Take 0.4 mg by mouth daily as needed (urinary difficulty).   TURMERIC PO Take 200 mg by mouth daily.   valACYclovir (VALTREX) 1000 MG tablet    vitamin C (ASCORBIC ACID) 500 MG tablet Take 500 mg by mouth 3 (three) times daily.     Allergies:   Clindamycin/lincomycin, Chantix [varenicline], Naproxen, and Zoloft [sertraline]   Social History   Socioeconomic History   Marital status: Single    Spouse name:  Not on file   Number of children: Not on file   Years of education: Not on file   Highest education level: Not on file  Occupational History   Not on file  Tobacco Use   Smoking status: Former    Types: Cigarettes   Smokeless tobacco: Current    Types: Chew  Substance and Sexual Activity   Alcohol use: Never   Drug use: Never   Sexual activity: Not on file  Other Topics Concern   Not on file  Social History Narrative   Not on file   Social Determinants of Health   Financial Resource Strain: Low Risk  (08/13/2022)   Overall Financial Resource Strain (CARDIA)    Difficulty of Paying Living Expenses: Not very hard  Food Insecurity: No Food Insecurity (08/13/2022)   Hunger Vital Sign    Worried About Running Out of Food in the Last Year: Never true    Ran Out  of Food in the Last Year: Never true  Transportation Needs: No Transportation Needs (08/13/2022)   PRAPARE - Administrator, Civil Service (Medical): No    Lack of Transportation (Non-Medical): No  Physical Activity: Not on file  Stress: Not on file  Social Connections: Not on file     Family History: The patient's family history includes Allergies in his half-brother; Heart disease in his father and paternal grandfather; Other in his mother.  ROS:   Review of Systems  Constitution: Negative for decreased appetite, fever and weight gain.  HENT: Negative for congestion, ear discharge, hoarse voice and sore throat.   Eyes: Negative for discharge, redness, vision loss in right eye and visual halos.  Cardiovascular: Negative for chest pain, dyspnea on exertion, leg swelling, orthopnea and palpitations.  Respiratory: Negative for cough, hemoptysis, shortness of breath and snoring.   Endocrine: Negative for heat intolerance and polyphagia.  Hematologic/Lymphatic: Negative for bleeding problem. Does not bruise/bleed easily.  Skin: Negative for flushing, nail changes, rash and suspicious lesions.  Musculoskeletal: Negative for arthritis, joint pain, muscle cramps, myalgias, neck pain and stiffness.  Gastrointestinal: Negative for abdominal pain, bowel incontinence, diarrhea and excessive appetite.  Genitourinary: Negative for decreased libido, genital sores and incomplete emptying.  Neurological: Negative for brief paralysis, focal weakness, headaches and loss of balance.  Psychiatric/Behavioral: Negative for altered mental status, depression and suicidal ideas.  Allergic/Immunologic: Negative for HIV exposure and persistent infections.    EKGs/Labs/Other Studies Reviewed:    The following studies were reviewed today:   EKG: Sinus rhythm, heart rate 67 beats a minute compared to prior EKG no significant change.  CCTA 07/03/2021 FINDINGS: Coronary calcium score: The patient's  coronary artery calcium score is 432, which places the patient in the 99th percentile.   Coronary arteries: Normal coronary origins.  Right dominance.   Right Coronary Artery: Normal caliber vessel, gives rise to PDA. There is focal mixed calcified and noncalcified plaque in the proximal RCA, with 25-49% stenosis. There is focal mixed calcified and noncalcified plaque in the mid RCA, with 25-49% stenosis. There is mixed calcified and noncalcified plaque in the distal RCA with 1-24% stenosis.   Left Main Coronary Artery: Normal caliber vessel. No significant plaque or stenosis.   Left Anterior Descending Coronary Artery: Normal caliber vessel. Diffused mixed calcified and noncalcified plaque. There is a focal mixed plaque at the origin of the LAD, with 1-24% stenosis. There is a focal calcified plaque in the mid LAD with 25-49% stenosis. Gives rise to 2 diagonal branches.  Left Circumflex Artery: Normal caliber vessel. There is scattered noncalcified plaque without significant stenosis. Gives rise to 2 OM branches.   Aorta: Normal size, 30 mm at the mid ascending aorta (level of the PA bifurcation) measured double oblique. Aortic atherosclerosis. No dissection seen in visualized portions of the aorta.   Aortic Valve: No calcifications. Trileaflet.   Other findings:   Normal pulmonary vein drainage into the left atrium.   Normal left atrial appendage without a thrombus.   Normal size of the pulmonary artery.   Normal appearance of the pericardium.   Step artifact is present, but images are interpretable.   IMPRESSION: 1. At least mild nonobstructive CAD, CADRADS = 2. Cannot exclude flow limiting stenosis based on available image, so CT FFR will be performed and reported separately.   2. Coronary calcium score of 432. This was 99th percentile for age and sex matched control. Of note, prior calcium score 610, discrepancy likely due to difference between scanners.   3. Normal  coronary origin with right dominance.   There is significant step artifact, with focal areas of the LAD, LCx, and RCA unable to be fully evaluated. The rest of the portions of the vessels are interpretable, but cannot evaluate for stenosis in the artifact-impacted sections.  Recent Labs: No results found for requested labs within last 365 days.  Recent Lipid Panel    Component Value Date/Time   CHOL 231 (H) 08/12/2022 0927   TRIG 464 (H) 08/12/2022 0927   HDL 32 (L) 08/12/2022 0927   CHOLHDL 7.2 (H) 08/12/2022 0927   LDLCALC 117 (H) 08/12/2022 0927    Physical Exam:    VS:  BP 122/70   Pulse 87   Ht 6\' 2"  (1.88 m)   Wt 102.6 kg   SpO2 99%   BMI 29.04 kg/m     Wt Readings from Last 3 Encounters:  11/22/22 102.6 kg  08/12/22 97.9 kg  11/04/21 96.3 kg     GEN: Well nourished, well developed in no acute distress HEENT: Normal NECK: No JVD; No carotid bruits LYMPHATICS: No lymphadenopathy CARDIAC: S1S2 noted,RRR, no murmurs, rubs, gallops RESPIRATORY:  Clear to auscultation without rales, wheezing or rhonchi  ABDOMEN: Soft, non-tender, non-distended, +bowel sounds, no guarding. EXTREMITIES: No edema, No cyanosis, no clubbing MUSCULOSKELETAL:  No deformity  SKIN: Warm and dry NEUROLOGIC:  Alert and oriented x 3, non-focal PSYCHIATRIC:  Normal affect, good insight  ASSESSMENT:    1. Medication management   2. Mixed hyperlipidemia   3. DOE (dyspnea on exertion)   4. Coronary artery disease involving native coronary artery of native heart without angina pectoris      PLAN:    He is having shortness of breath I like to get an echocardiogram to assess LV function and any other structural abnormalities.  Still is experiencing some palpitations from previous ports have asked the patient prior to get mobile Kardia which she did but has not been able to program this to his phone.  I have asked him to get that done this week able to make sure he is not going into any  arrhythmia.  Coronary artery disease-no angina.  He restarted his statin medication we will get lipid profile today.    Blood pressure is acceptable, will continue to monitor.  The patient is in agreement with the above plan. The patient left the office in stable condition.  The patient will follow up in 12 weeks or sooner if needed.   Medication Adjustments/Labs and Tests  Ordered: Current medicines are reviewed at length with the patient today.  Concerns regarding medicines are outlined above.  Orders Placed This Encounter  Procedures   Comp Met (CMET)   Magnesium   Lipid panel   CBC   TSH   ECHOCARDIOGRAM COMPLETE   No orders of the defined types were placed in this encounter.   Patient Instructions  Medication Instructions:  Your physician recommends that you continue on your current medications as directed. Please refer to the Current Medication list given to you today.  *If you need a refill on your cardiac medications before your next appointment, please call your pharmacy*   Lab Work: Your physician recommends that you have the following labs drawn today: CMET, Magnesium, Lipids, CBC and TSH   If you have labs (blood work) drawn today and your tests are completely normal, you will receive your results only by: MyChart Message (if you have MyChart) OR A paper copy in the mail If you have any lab test that is abnormal or we need to change your treatment, we will call you to review the results.   Testing/Procedures: Your physician has requested that you have an echocardiogram. Echocardiography is a painless test that uses sound waves to create images of your heart. It provides your doctor with information about the size and shape of your heart and how well your heart's chambers and valves are working. This procedure takes approximately one hour. There are no restrictions for this procedure. Please do NOT wear cologne, perfume, aftershave, or lotions (deodorant is  allowed). Please arrive 15 minutes prior to your appointment time.    Follow-Up: At [facility], you and your health needs are our priority.  As part of our continuing mission to provide you with exceptional heart care, we have created designated Provider Care Teams.  These Care Teams include your primary Cardiologist (physician) and Advanced Practice Providers (APPs -  Physician Assistants and Nurse Practitioners) who all work together to provide you with the care you need, when you need it.  We recommend signing up for the patient portal called "MyChart".  Sign up information is provided on this After Visit Summary.  MyChart is used to connect with patients for Virtual Visits (Telemedicine).  Patients are able to view lab/test results, encounter notes, upcoming appointments, etc.  Non-urgent messages can be sent to your provider as well.   To learn more about what you can do with MyChart, go to [email or web address].    Your next appointment:   12 week(s)  Provider:   Thomasene Ripple, DO     Adopting a Healthy Lifestyle.  Know what a healthy weight is for you (roughly BMI <25) and aim to maintain this   Aim for 7+ servings of fruits and vegetables daily   65-80+ fluid ounces of water or unsweet tea for healthy kidneys   Limit to max 1 drink of alcohol per day; avoid smoking/tobacco   Limit animal fats in diet for cholesterol and heart health - choose grass fed whenever available   Avoid highly processed foods, and foods high in saturated/trans fats   Aim for low stress - take time to unwind and care for your mental health   Aim for 150 min of moderate intensity exercise weekly for heart health, and weights twice weekly for bone health   Aim for 7-9 hours of sleep daily   When it comes to diets, agreement about the perfect plan isnt easy to find, even among the experts. Experts  at the [facility] [employer] developed an idea known as the Healthy Eating Plate.  Just imagine a plate divided into logical, healthy portions.   The emphasis is on diet quality:   Load up on vegetables and fruits - one-half of your plate: Aim for color and variety, and remember that potatoes dont count.   Go for whole grains - one-quarter of your plate: Whole wheat, barley, wheat berries, quinoa, oats, brown rice, and foods made with them. If you want pasta, go with whole wheat pasta.   Protein power - one-quarter of your plate: Fish, chicken, beans, and nuts are all healthy, versatile protein sources. Limit red meat.   The diet, however, does go beyond the plate, offering a few other suggestions.   Use healthy plant oils, such as olive, canola, soy, corn, sunflower and peanut. Check the labels, and avoid partially hydrogenated oil, which have unhealthy trans fats.   If youre thirsty, drink water. Coffee and tea are good in moderation, but skip sugary drinks and limit milk and dairy products to one or two daily servings.   The type of carbohydrate in the diet is more important than the amount. Some sources of carbohydrates, such as vegetables, fruits, whole grains, and beans-are healthier than others.   Finally, stay active  Signed, Thomasene Ripple, DO  11/22/2022 11:03 AM    [facility]

## 2022-11-23 LAB — COMPREHENSIVE METABOLIC PANEL
ALT: 29 IU/L (ref 0–44)
AST: 18 IU/L (ref 0–40)
Albumin/Globulin Ratio: 1.7 (ref 1.2–2.2)
Albumin: 4.7 g/dL (ref 4.1–5.1)
Alkaline Phosphatase: 53 IU/L (ref 44–121)
BUN/Creatinine Ratio: 21 — ABNORMAL HIGH (ref 9–20)
BUN: 21 mg/dL (ref 6–24)
Bilirubin Total: 0.2 mg/dL (ref 0.0–1.2)
CO2: 22 mmol/L (ref 20–29)
Calcium: 9.9 mg/dL (ref 8.7–10.2)
Chloride: 101 mmol/L (ref 96–106)
Creatinine, Ser: 1.01 mg/dL (ref 0.76–1.27)
Globulin, Total: 2.8 g/dL (ref 1.5–4.5)
Glucose: 160 mg/dL — ABNORMAL HIGH (ref 70–99)
Potassium: 4.5 mmol/L (ref 3.5–5.2)
Sodium: 139 mmol/L (ref 134–144)
Total Protein: 7.5 g/dL (ref 6.0–8.5)
eGFR: 91 mL/min/{1.73_m2} (ref >59)

## 2022-11-23 LAB — CBC
Hematocrit: 38.5 % (ref 37.5–51.0)
Hemoglobin: 12.8 g/dL — ABNORMAL LOW (ref 13.0–17.7)
MCH: 28.8 pg (ref 26.6–33.0)
MCHC: 33.2 g/dL (ref 31.5–35.7)
MCV: 87 fL (ref 79–97)
Platelets: 269 10*3/uL (ref 150–450)
RBC: 4.45 x10E6/uL (ref 4.14–5.80)
RDW: 13.8 % (ref 11.6–15.4)
WBC: 14.5 10*3/uL — ABNORMAL HIGH (ref 3.4–10.8)

## 2022-11-23 LAB — LIPID PANEL
Chol/HDL Ratio: 3.7 ratio (ref 0.0–5.0)
Cholesterol, Total: 147 mg/dL (ref 100–199)
HDL: 40 mg/dL (ref >39)
LDL Chol Calc (NIH): 51 mg/dL (ref 0–99)
Triglycerides: 372 mg/dL — ABNORMAL HIGH (ref 0–149)
VLDL Cholesterol Cal: 56 mg/dL — ABNORMAL HIGH (ref 5–40)

## 2022-11-23 LAB — TSH: TSH: 0.81 u[IU]/mL (ref 0.450–4.500)

## 2022-11-23 LAB — MAGNESIUM: Magnesium: 1.8 mg/dL (ref 1.6–2.3)

## 2022-11-25 ENCOUNTER — Telehealth: Payer: Self-pay | Admitting: Cardiology

## 2022-11-25 NOTE — Telephone Encounter (Signed)
Spoke with patient and gave him lab results/recommendations per Dr. Harriet Masson: "You have had some improvement with your LDL and HDL, your triglycerides are still elevated.  I will like to add fenofibrate to help with lowering your triglyceride which should be less than 150 currently it is 372.  Usually cutting back on carbs as well as beer will help bring this number down.  I am happy if you want to try this for the next 12 weeks repeat the blood work if not he can go ahead and start the medications. Your hemoglobin is on the slightly lower side that something we can also repeat to make sure that is not dropping. On your CMP your blood glucose is slightly elevated." Patient stated he wanted the weekend to think about it and will contact our clinic on Monday. If patient decides on fenofibrate, what is the dose and frequency of it?

## 2022-11-25 NOTE — Telephone Encounter (Signed)
Pt returning call for lab results, informed him Delana Meyer is not in office today, he wants to know if someone else can call him to go over results.

## 2022-12-01 ENCOUNTER — Ambulatory Visit (HOSPITAL_COMMUNITY): Payer: Self-pay | Attending: Cardiology

## 2022-12-01 DIAGNOSIS — R0609 Other forms of dyspnea: Secondary | ICD-10-CM | POA: Insufficient documentation

## 2022-12-01 LAB — ECHOCARDIOGRAM COMPLETE
Area-P 1/2: 2.84 cm2
S' Lateral: 2.4 cm

## 2022-12-16 ENCOUNTER — Ambulatory Visit: Payer: Self-pay | Admitting: Allergy

## 2022-12-22 NOTE — Progress Notes (Deleted)
New Patient Note  RE: Larry Barber MRN: [id number] DOB: 1971/04/05 Date of Office Visit: 12/23/2022  Consult requested by: Deland Pretty, MD Primary care provider: Deland Pretty, MD  Chief Complaint: No chief complaint on file.  History of Present Illness: I had the pleasure of seeing Larry Barber for initial evaluation at the [facility] on 12/22/2022. He is a 52 y.o. male, who is referred here by Deland Pretty, MD for the evaluation of angioedema.  Swelling started about *** ago. Mainly occurs on his ***. Describes them as ***. Individual swelling episodes last about ***. No ecchymosis upon resolution. Associated symptoms include: ***.  Frequency of episodes: ***. Suspected triggers are ***. Denies any *** fevers, chills, changes in medications, foods, personal care products or recent infections. He has tried the following therapies: *** with *** benefit. Systemic steroids ***. Currently on ***.  Previous work up includes: ***. Previous history of swelling: {Blank single:19197::"yes","no"}. Family history of angioedema: {Blank single:19197::"yes","no"}. Patient is up to date with the following cancer screening tests: ***. Ace-inhibitor use: {Blank single:19197::"yes","no"}  11/11/2022 ER visit: "Larry Barber presents with his third episode of tongue swelling. The other 2 happened without obvious triggers, but he responded to epinephrine, Benadryl, and steroids. He went to bed around 9 PM, and he was normal. When he awoke this morning, the left side of his tongue and his face were swollen. He denies any exposures to allergens. He is on an ARB but no ACE inhibitor. The patient would like to avoid epinephrine unless absolutely necessary. He states that it made him feel like he was coming out of his skin.   Larry Barber presents to the ED with angioedema. This is his third episode, and he has no known triggers. He is not on an ACE inhibitor but  does take an ARB. As documented, he wanted to avoid epinephrine. He was given Decadron, and during his 3-hour emergency department stay, his symptoms improved. Tongue swelling had significantly decreased. I have ordered lab test to evaluate for hereditary angioedema. These are pending. I have referred him to allergy/immunology. Careful return precautions were given"  C4 Complement 12 - 38 mg/dL 47 High    C1 Esterase Inhibitor 21 - 39 mg/dL 32    Assessment and Plan: Larry Barber is a 52 y.o. male with: No problem-specific Assessment & Plan notes found for this encounter.  No follow-ups on file.  No orders of the defined types were placed in this encounter.  Lab Orders  No laboratory test(s) ordered today    Other allergy screening: Asthma: {Blank single:19197::"yes","no"} Rhino conjunctivitis: {Blank single:19197::"yes","no"} Food allergy: {Blank single:19197::"yes","no"} Medication allergy: {Blank single:19197::"yes","no"} Hymenoptera allergy: {Blank single:19197::"yes","no"} Urticaria: {Blank single:19197::"yes","no"} Eczema:{Blank single:19197::"yes","no"} History of recurrent infections suggestive of immunodeficency: {Blank single:19197::"yes","no"}  Diagnostics: Spirometry:  Tracings reviewed. His effort: {Blank single:19197::"Good reproducible efforts.","It was hard to get consistent efforts and there is a question as to whether this reflects a maximal maneuver.","Poor effort, data can not be interpreted."} FVC: ***L FEV1: ***L, ***% predicted FEV1/FVC ratio: ***% Interpretation: {Blank single:19197::"Spirometry consistent with mild obstructive disease","Spirometry consistent with moderate obstructive disease","Spirometry consistent with severe obstructive disease","Spirometry consistent with possible restrictive disease","Spirometry consistent with mixed obstructive and restrictive disease","Spirometry uninterpretable due to technique","Spirometry consistent with normal  pattern","No overt abnormalities noted given today's efforts"}.  Please see scanned spirometry results for details.  Skin Testing: {Blank single:19197::"Select foods","Environmental allergy panel","Environmental allergy panel and select foods","Food allergy panel","None","Deferred due to recent antihistamines use"}. *** Results discussed with patient/family.  Past Medical History: Patient Active Problem List   Diagnosis Date Noted   Coronary artery disease involving native coronary artery of native heart without angina pectoris 08/12/2022   Hyperlipidemia 09/14/2021   GERD (gastroesophageal reflux disease) 06/25/2021   Routine health maintenance 01/23/2014   Past Medical History:  Diagnosis Date   GERD (gastroesophageal reflux disease)    Past Surgical History: Past Surgical History:  Procedure Laterality Date   NO PAST SURGERIES     Medication List:  Current Outpatient Medications  Medication Sig Dispense Refill   allopurinol (ZYLOPRIM) 300 MG tablet Take 300 mg by mouth daily.     ALPRAZolam (XANAX) 0.5 MG tablet Take 0.5 mg by mouth at bedtime as needed for sleep.     amLODipine (NORVASC) 5 MG tablet Take 5 mg by mouth daily.     aspirin 81 MG chewable tablet Chew 1 tablet by mouth daily.     Coenzyme Q10 (COQ10 PO) Take 300 mg by mouth daily.     EPINEPHrine 0.3 mg/0.3 mL IJ SOAJ injection Inject into the muscle.     esomeprazole (NEXIUM) 20 MG capsule Take 20 mg by mouth daily as needed (heart burn).     ferrous sulfate 220 (44 Fe) MG/5ML solution Take 10 mLs by mouth daily.     GARLIC PO Take 40 mg by mouth daily.     losartan (COZAAR) 100 MG tablet Take 100 mg by mouth daily.     Melatonin 3 MG SUBL Take 3 mg by mouth at bedtime.     Menaquinone-7 (VITAMIN K2 PO) Take 160 mg by mouth 2 (two) times daily.     Multiple Vitamins-Minerals (PHYTOMULTI PO) Take 1 tablet by mouth daily.     nitroGLYCERIN (NITROSTAT) 0.4 MG SL tablet Place 1 tablet (0.4 mg total) under the  tongue every 5 (five) minutes as needed for chest pain. Up to three times. 25 tablet 3   OVER THE COUNTER MEDICATION Take 1 tablet by mouth daily. Blood Sugar Omega     predniSONE (DELTASONE) 5 MG tablet Take 1 tablet by mouth as needed.     propranolol (INDERAL) 10 MG tablet Take 1 tablet (10 mg total) by mouth every 8 (eight) hours. 270 tablet 3   rosuvastatin (CRESTOR) 20 MG tablet Take 20 mg by mouth daily.     tamsulosin (FLOMAX) 0.4 MG CAPS capsule Take 0.4 mg by mouth daily as needed (urinary difficulty).     TURMERIC PO Take 200 mg by mouth daily.     valACYclovir (VALTREX) 1000 MG tablet      vitamin C (ASCORBIC ACID) 500 MG tablet Take 500 mg by mouth 3 (three) times daily.     No current facility-administered medications for this visit.   Allergies: Allergies  Allergen Reactions   Clindamycin/Lincomycin Other (See Comments)    Other reaction(s): Other [place]    Chantix [Varenicline]     Other reaction(s): "made me crazy"   Naproxen     Other reaction(s): nausea   Zoloft [Sertraline] Nausea And Vomiting   Social History: Social History   Socioeconomic History   Marital status: Single    Spouse name: Not on file   Number of children: Not on file   Years of education: Not on file   Highest education level: Not on file  Occupational History   Not on file  Tobacco Use   Smoking status: Former    Types: Cigarettes   Smokeless tobacco: Current    Types: Loss adjuster, chartered  Substance and Sexual Activity   Alcohol use: Never   Drug use: Never   Sexual activity: Not on file  Other Topics Concern   Not on file  Social History Narrative   Not on file   Social Determinants of Health   Financial Resource Strain: Low Risk  (08/13/2022)   Overall Financial Resource Strain (CARDIA)    Difficulty of Paying Living Expenses: Not very hard  Food Insecurity: No Food Insecurity (08/13/2022)   Hunger Vital Sign    Worried About Running Out of Food in the Last Year: Never true    Ran Out  of Food in the Last Year: Never true  Transportation Needs: No Transportation Needs (08/13/2022)   PRAPARE - Hydrologist (Medical): No    Lack of Transportation (Non-Medical): No  Physical Activity: Not on file  Stress: Not on file  Social Connections: Not on file   Lives in a ***. Smoking: *** Occupation: ***  Environmental HistoryFreight forwarder in the house: Estate agent in the family room: {Blank single:19197::"yes","no"} Carpet in the bedroom: {Blank single:19197::"yes","no"} Heating: {Blank single:19197::"electric","gas","heat pump"} Cooling: {Blank single:19197::"central","window","heat pump"} Pet: {Blank single:19197::"yes ***","no"}  Family History: Family History  Problem Relation Age of Onset   Other Mother        Accident   Heart disease Father    Heart disease Paternal Grandfather    Allergies Half-Brother    Problem                               Relation Asthma                                   *** Eczema                                *** Food allergy                          *** Allergic rhino conjunctivitis     ***  Review of Systems  Constitutional:  Negative for appetite change, chills, fever and unexpected weight change.  HENT:  Negative for congestion and rhinorrhea.   Eyes:  Negative for itching.  Respiratory:  Negative for cough, chest tightness, shortness of breath and wheezing.   Cardiovascular:  Negative for chest pain.  Gastrointestinal:  Negative for abdominal pain.  Genitourinary:  Negative for difficulty urinating.  Skin:  Negative for rash.  Neurological:  Negative for headaches.    Objective: There were no vitals taken for this visit. There is no height or weight on file to calculate BMI. Physical Exam Vitals and nursing note reviewed.  Constitutional:      Appearance: Normal appearance. He is well-developed.  HENT:     Head: Normocephalic and atraumatic.     Right  Ear: Tympanic membrane and external ear normal.     Left Ear: Tympanic membrane and external ear normal.     Nose: Nose normal.     Mouth/Throat:     Mouth: Mucous membranes are moist.     Pharynx: Oropharynx is clear.  Eyes:     Conjunctiva/sclera: Conjunctivae normal.  Cardiovascular:     Rate and Rhythm: Normal rate and regular rhythm.     Heart sounds: Normal heart  sounds. No murmur heard.    No friction rub. No gallop.  Pulmonary:     Effort: Pulmonary effort is normal.     Breath sounds: Normal breath sounds. No wheezing, rhonchi or rales.  Musculoskeletal:     Cervical back: Neck supple.  Skin:    General: Skin is warm.     Findings: No rash.  Neurological:     Mental Status: He is alert and oriented to person, place, and time.  Psychiatric:        Behavior: Behavior normal.    The plan was reviewed with the patient/family, and all questions/concerned were addressed.  It was my pleasure to see Larry Barber today and participate in his care. Please feel free to contact me with any questions or concerns.  Sincerely,  Rexene Alberts, DO Allergy & Immunology  [facility] office: [place] office: [phone]

## 2022-12-23 ENCOUNTER — Ambulatory Visit: Payer: Self-pay | Admitting: Allergy

## 2022-12-24 ENCOUNTER — Emergency Department (HOSPITAL_COMMUNITY)
Admission: EM | Admit: 2022-12-24 | Discharge: 2022-12-24 | Disposition: A | Discharge status: Home / Self Care | Payer: Self-pay | Attending: Emergency Medicine | Admitting: Emergency Medicine

## 2022-12-24 ENCOUNTER — Other Ambulatory Visit: Payer: Self-pay

## 2022-12-24 ENCOUNTER — Encounter (HOSPITAL_COMMUNITY): Payer: Self-pay

## 2022-12-24 DIAGNOSIS — Z7982 Long term (current) use of aspirin: Secondary | ICD-10-CM | POA: Insufficient documentation

## 2022-12-24 DIAGNOSIS — T783XXA Angioneurotic edema, initial encounter: Secondary | ICD-10-CM | POA: Insufficient documentation

## 2022-12-24 MED ORDER — METHYLPREDNISOLONE SODIUM SUCC 125 MG IJ SOLR
125.0000 mg | Freq: Once | INTRAMUSCULAR | Status: AC
  Administered 2022-12-24: 125 mg via INTRAVENOUS
  Filled 2022-12-24: qty 2

## 2022-12-24 MED ORDER — PREDNISONE 10 MG PO TABS: 20.0000 mg | ORAL_TABLET | Freq: Every day | ORAL | 0 refills | Status: DC

## 2022-12-24 MED ORDER — DIPHENHYDRAMINE HCL 50 MG/ML IJ SOLN
50.0000 mg | Freq: Once | INTRAMUSCULAR | Status: AC
  Administered 2022-12-24: 50 mg via INTRAVENOUS
  Filled 2022-12-24: qty 1

## 2022-12-24 NOTE — ED Provider Notes (Signed)
[facility] Provider Note   CSN: [id number] Arrival date & time: 12/24/22  1028     History  Chief Complaint  Patient presents with   Oral Swelling    Larry Barber is a 52 y.o. male.  HPI 52 yo male complaining of tongue swelling felt slighlty sore at 0800 then noted swelling.  Patient has had 2 previous episodes and has had ace and arb stopped. Swelling began at 0830, no dyspnea.  Maybe had some swelling past two weeks.  Two previous episodes September and November and January.   Treated first time with epi seocond time with steroids and benadryl. Losartan stopped after last episode. Took norvasc only this am      Home Medications Prior to Admission medications   Medication Sig Start Date End Date Taking? Authorizing Provider  amLODipine (NORVASC) 10 MG tablet Take 10 mg by mouth daily.   Yes [provider]  aspirin 81 MG chewable tablet Chew 81 mg by mouth daily. 05/26/21  Yes [provider]  Coenzyme Q10 (COQ10 PO) Take 300 mg by mouth daily.   Yes [provider]  EPINEPHrine 0.3 mg/0.3 mL IJ SOAJ injection Inject into the muscle. 09/19/22  Yes [provider]  esomeprazole (NEXIUM) 20 MG capsule Take 20 mg by mouth daily as needed (heart burn).   Yes [provider]  Melatonin 3 MG SUBL Take 3 mg by mouth at bedtime.   Yes [provider]  nitroGLYCERIN (NITROSTAT) 0.4 MG SL tablet Place 1 tablet (0.4 mg total) under the tongue every 5 (five) minutes as needed for chest pain. Up to three times. 06/26/21 12/24/22 Yes Tobb, Kardie, DO  predniSONE (DELTASONE) 10 MG tablet Take 2 tablets (20 mg total) by mouth daily. 12/24/22  Yes Larry Boss, MD  rosuvastatin (CRESTOR) 20 MG tablet Take 20 mg by mouth daily.   Yes [provider]  allopurinol (ZYLOPRIM) 300 MG tablet Take 300 mg by mouth daily. Patient not taking: Reported on 12/24/2022    [provider]   losartan (COZAAR) 100 MG tablet Take 100 mg by mouth daily. Patient not taking: Reported on 12/24/2022    [provider]      Allergies    Clindamycin/lincomycin, Chantix [varenicline], Naproxen, and Zoloft [sertraline]    Review of Systems   Review of Systems  Physical Exam Updated Vital Signs BP (!) 141/89   Pulse 64   Temp 97.6 F (36.4 C) (Oral)   Resp 18   Ht 1.88 m (6' 2"$ )   Wt 102.5 kg   SpO2 98%   BMI 29.02 kg/m  Physical Exam Vitals and nursing note reviewed.  Constitutional:      Appearance: He is well-developed.  HENT:     Head: Normocephalic and atraumatic.     Right Ear: External ear normal.     Left Ear: External ear normal.     Nose: Nose normal.     Mouth/Throat:     Mouth: Mucous membranes are moist.     Pharynx: Oropharynx is clear.     Comments: Right side of tongue swollen No posterior oral edema Eyes:     Extraocular Movements: Extraocular movements intact.  Neck:     Trachea: No tracheal deviation.  Pulmonary:     Effort: Pulmonary effort is normal.  Musculoskeletal:        General: Normal range of motion.  Skin:    General: Skin is warm and dry.  Neurological:     Mental Status: He is alert and oriented to person, place, and time.  Psychiatric:        Mood and Affect: Mood normal.        Behavior: Behavior normal.     ED Results / Procedures / Treatments   Labs (all labs ordered are listed, but only abnormal results are displayed) Labs Reviewed - No data to display  EKG None  Radiology No results found.  Procedures Procedures    Medications Ordered in ED Medications  methylPREDNISolone sodium succinate (SOLU-MEDROL) 125 mg/2 mL injection 125 mg (125 mg Intravenous Given 12/24/22 1119)  diphenhydrAMINE (BENADRYL) injection 50 mg (50 mg Intravenous Given 12/24/22 1118)    ED Course/ Medical Decision Making/ A&P                             Medical Decision Making Risk Prescription drug  management.   Reevaluated multiple times.  He states that the symptoms are resolving and swelling is clinically decreased. Patient will be given prednisone and continue Benadryl until symptoms resolve. We have discussed return precautions. Has stopped ACE and ARB  He has outpatient follow-up with allergist        Final Clinical Impression(s) / ED Diagnoses Final diagnoses:  Angioedema, initial encounter    Rx / DC Orders ED Discharge Orders          Ordered    predniSONE (DELTASONE) 10 MG tablet  Daily        12/24/22 1449              Larry Boss, MD 12/24/22 1450

## 2022-12-24 NOTE — ED Triage Notes (Signed)
Patient reports tongue swelling. No difficulty swallowing or breathing.  Reports this is the 4th time it has happened.  Being tested for hereditary angioedema as patient is not on ACE inhibitor but is on ARB

## 2022-12-24 NOTE — Discharge Instructions (Signed)
Take prednisone and Benadryl until symptoms have completely resolved Return immediately if you have worsening swelling or any difficulty breathing or swallowing. Follow-up with your allergist as scheduled. Do not take any ACE inhibitors or ARB's

## 2022-12-27 ENCOUNTER — Encounter: Payer: Self-pay | Admitting: Cardiology

## 2022-12-27 DIAGNOSIS — E782 Mixed hyperlipidemia: Secondary | ICD-10-CM

## 2022-12-31 NOTE — Addendum Note (Signed)
Addended by: Patria Mane A on: 12/31/2022 02:40 PM   Modules accepted: Orders

## 2023-01-04 ENCOUNTER — Encounter: Payer: Self-pay | Admitting: Allergy

## 2023-01-04 ENCOUNTER — Other Ambulatory Visit: Payer: Self-pay

## 2023-01-04 ENCOUNTER — Ambulatory Visit: Payer: Self-pay | Admitting: Allergy

## 2023-01-04 VITALS — BP 132/80 | HR 97 | Temp 98.4°F | Resp 18 | Ht 74.0 in | Wt 220.0 lb

## 2023-01-04 DIAGNOSIS — T783XXD Angioneurotic edema, subsequent encounter: Secondary | ICD-10-CM

## 2023-01-04 DIAGNOSIS — T783XXA Angioneurotic edema, initial encounter: Secondary | ICD-10-CM | POA: Insufficient documentation

## 2023-01-04 DIAGNOSIS — T7840XD Allergy, unspecified, subsequent encounter: Secondary | ICD-10-CM

## 2023-01-04 DIAGNOSIS — T7840XA Allergy, unspecified, initial encounter: Secondary | ICD-10-CM | POA: Insufficient documentation

## 2023-01-04 MED ORDER — FAMOTIDINE 20 MG PO TABS: 20.0000 mg | ORAL_TABLET | Freq: Two times a day (BID) | ORAL | Status: AC

## 2023-01-04 MED ORDER — CETIRIZINE HCL 10 MG PO TABS: 10.0000 mg | ORAL_TABLET | Freq: Two times a day (BID) | ORAL | Status: AC

## 2023-01-04 NOTE — Assessment & Plan Note (Addendum)
4 episodes of significant tongue angioedema and 2 facial edema since November 2023. Triggers unknown but responsive to steroids and antihistamines. Denies changes in diet, personal care products or recent infections. He started to aspirin more frequently since January. His blood pressure medication was switched from ARB to amlodipine. Chews tobacco and self-insured. Not on Ace-inh, no family history of angioedema. Etiology is unclear but it seems to be histamine mediated.  Keep track of episodes and take pictures. Write down what you had eaten or done that day. If you have another episode then okay to take prednisone '10mg'$  to '20mg'$  once a day and let us know.  Stop aspirin for now.  Stop eating red meat - no beef, pork, lamb.  Only take the amlodipine daily. No other over the counter supplements. Try to stop chewing tobacco.  Start zyrtec (cetirizine) '10mg'$  twice a day. If symptoms are not controlled or causes drowsiness let us know. Start Pepcid (famotidine) '20mg'$  twice a day.  Avoid the following potential triggers: alcohol, tight clothing, NSAIDs, hot showers and getting overheated. Get bloodwork. Epinephrine injectable device and demonstrated proper use. For mild symptoms you can take over the counter antihistamines such as Benadryl 1-2 tablets = 25-'50mg'$ .and monitor symptoms closely. If symptoms worsen or if you have severe symptoms including breathing issues, throat closure, significant swelling, whole body hives, severe diarrhea and vomiting, lightheadedness then inject epinephrine and seek immediate medical care afterwards. Emergency action plan given.

## 2023-01-04 NOTE — Patient Instructions (Addendum)
Swelling At this time, I'm not sure what's causing these episodes. Keep track of episodes and take pictures. Write down what you had eaten or done that day. If you have another episode then okay to take prednisone '10mg'$  to '20mg'$  once a day and let us know.  Stop aspirin for now.  Stop eating red meat - no beef, pork, lamb.  Only take the amlodipine daily. No other over the counter supplements.  Try to stop chewing tobacco.  Start zyrtec (cetirizine) '10mg'$  twice a day. If symptoms are not controlled or causes drowsiness let us know. Start pepcid (famotidine) '20mg'$  twice a day.  Avoid the following potential triggers: alcohol, tight clothing, NSAIDs, hot showers and getting overheated. Get bloodwork:  We are ordering labs, so please allow 1-2 weeks for the results to come back. With the newly implemented Cures Act, the labs might be visible to you at the same time that they become visible to me. However, I will not address the results until all of the results are back, so please be patient.    Epinephrine injectable device and demonstrated proper use. For mild symptoms you can take over the counter antihistamines such as Benadryl 1-2 tablets = 25-'50mg'$ .and monitor symptoms closely. If symptoms worsen or if you have severe symptoms including breathing issues, throat closure, significant swelling, whole body hives, severe diarrhea and vomiting, lightheadedness then inject epinephrine and seek immediate medical care afterwards. Emergency action plan given.  Follow up in 4 weeks or sooner if needed. You can ask [facility] for a discount. Buy the generic zyrtec and generic Pepcid at [employer], [employer] or [email or web address].

## 2023-01-04 NOTE — Progress Notes (Signed)
New Patient Note  RE: Cayl Pike MRN: [id number] DOB: [birth year] Date of Office Visit: 01/04/2023  Consult requested by: Deland Pretty, MD Primary care provider: Deland Pretty, MD  Chief Complaint: Allergic Reaction (Just got out the ER this morning not sure the cause of the reaction -started mid 11/23. January stopped all medications and supplements )  History of Present Illness: I had the pleasure of seeing Nicolai Catano for initial evaluation at the [facility] on 01/04/2023. He is a 52 y.o. male, who is referred here by Deland Pretty, MD for the evaluation of angioedema/allergic reaction.  Swelling started in November 2023. Mainly occurs on his tongue. Last episode was yesterday and noted slight left face/cheek swelling.  Describes them as mainly non-pruritic. Individual swelling episodes last about 6 hours after receiving IM steroids, famotidine, benadryl. Associated symptoms include: none.  Frequency of episodes: patient had 4 tongue swellings and 2 slight facial swelling since November 2023. Suspected triggers are unknown.  Sometimes patient wakes up with these symptoms sometimes. He stopped zyrtec on Saturday and was eating red meat which he normally doesn't eat.   Denies any fevers, chills, changes in medications, foods, personal care products or recent infections. He has tried the following therapies: benadryl, zyrtec, steroids, famotidine with good benefit. Systemic steroids yes. Currently on zyrtec '10mg'$  daily.  Previous work up includes: none. Previous history of swelling: no. Family history of angioedema: no. Patient is up to date with the following cancer screening tests: physical exam. Ace-inhibitor use: no Patient was on Losartan but now on amlodipine.  Patient was on aspirin for 2 yrs but he was not taking it daily but since January he has been taking it daily.  Patient stopped taking losartan as well.   Reviewed images on the phone -  significant unilateral tongue swelling.   Patient has Epipen and did not have to use it.  The first time he went to the ER, he received IM epi which made him feel bad for about 15 minutes but did not get immediate relief.   No respiratory compromise.   Patient still chewing tobacco.  01/04/2023 ER visit: "Patient reports swelling on the right side of his throat. Patient reports yesterday he had swelling on the left side of his face. Patient reports a history of tongue swelling and reports that he is supposed to have allergy testing today at 1030. Patient reports change in voice"  12/24/2022 ER visit: "52 yo male complaining of tongue swelling felt slightly sore at 0800 then noted swelling.  Patient has had 2 previous episodes and has had ace and arb stopped. Swelling began at 0830, no dyspnea.  Maybe had some swelling past two weeks.  Two previous episodes September and November and January.   Treated first time with epi seocond time with steroids and benadryl. Losartan stopped after last episode. Took norvasc only this am  Reevaluated multiple times.  He states that the symptoms are resolving and swelling is clinically decreased. Patient will be given prednisone and continue Benadryl until symptoms resolve. We have discussed return precautions. Has stopped ACE and ARB  He has outpatient follow-up with allergist"  11/21/2022 ER visit: "Tydrick Valtierra presents with his third episode of tongue swelling. The other 2 happened without obvious triggers, but he responded to epinephrine, Benadryl, and steroids. He went to bed around 9 PM, and he was normal. When he awoke this morning, the left side of his tongue and his face were swollen. He  denies any exposures to allergens. He is on an ARB but no ACE inhibitor. The patient would like to avoid epinephrine unless absolutely necessary. He states that it made him feel like he was coming out of his skin.   Gillian Scarce presents to the  ED with angioedema. This is his third episode, and he has no known triggers. He is not on an ACE inhibitor but does take an ARB. As documented, he wanted to avoid epinephrine. He was given Decadron, and during his 3-hour emergency department stay, his symptoms improved. Tongue swelling had significantly decreased. I have ordered lab test to evaluate for hereditary angioedema. These are pending. I have referred him to allergy/immunology. Careful return precautions were given."  09/18/2022 UC visit: "History of high blood pressure on amlodipine, no other significant past medical history here for evaluation of tongue swelling. Symptoms started 1 hour ago and have been rapidly worsening. Feels swelling on the right side of his tongue. Slight difficulty swallowing. Just prior to symptoms started, was outside blowing leaves. Does not recall being bitten or stung by anything. He did have a new coffee creamer around 3 PM. No known allergies. He otherwise feels well and denies dizziness, palpitations, other rash or itching, chest pain or shortness of breath, difficulties breathing.   With no known allergies or significant past medical history here for sudden onset tongue swelling with unclear trigger. Reassuring vitals. Potentially due to sting or bite while blowing leaves versus new coffee creamer. He does have impressive tongue swelling on exam, but no other system involvement or evidence of anaphylaxis. However given potential for airway compromise, EpiPen administered in clinic, IV established and given 125 mg Solu-Medrol, 50 mg of Benadryl and 20 mg of famotidine. We will continue to monitor over the next 3 hours. Given EpiPen and prednisone prescription for home and instructions to follow-up with PCP. 5:53 PM recheck outpatient, reports he is feeling very well 6:39 PM reevaluation patient reports still feeling well, tongue size decreased. "   Assessment and Plan: Steave is a 52 y.o. 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 Triggers unknown but responsive to steroids and antihistamines. Denies changes in diet, personal care products or recent infections. He started to aspirin more frequently since January. His blood pressure medication was switched from ARB to amlodipine. Chews tobacco and self-insured. Not on Ace-inh, no family history of angioedema. Etiology is unclear but it seems to be histamine mediated.  Keep track of episodes and take pictures. Write down what you had eaten or done that day. If you have another episode then okay to take prednisone '10mg'$  to '20mg'$  once a day and let us know.  Stop aspirin for now.  Stop eating red meat - no beef, pork, lamb.  Only take the amlodipine daily. No other over the counter supplements. Try to stop chewing tobacco.  Start zyrtec (cetirizine) '10mg'$  twice a day. If symptoms are not controlled or causes drowsiness let us know. Start Pepcid (famotidine) '20mg'$  twice a day.  Avoid the following potential triggers: alcohol, tight clothing, NSAIDs, hot showers and getting overheated. Get bloodwork. Epinephrine injectable device and demonstrated proper use. For mild symptoms you can take over the counter antihistamines such as Benadryl 1-2 tablets = 25-'50mg'$ .and monitor symptoms closely. If symptoms worsen or if you have severe symptoms including breathing issues, throat closure, significant swelling, whole body hives, severe diarrhea and vomiting, lightheadedness then inject epinephrine and seek immediate medical care  afterwards. Emergency action plan given.  Return in about 4 weeks (around 02/01/2023).  Meds ordered this encounter  Medications   cetirizine (ZYRTEC ALLERGY) 10 MG tablet    Sig: Take 1 tablet (10 mg total) by mouth 2 (two) times daily.   famotidine (PEPCID) 20 MG tablet    Sig: Take 1 tablet (20 mg total) by mouth 2 (two) times daily.   Lab Orders          Alpha-Gal Panel         ANA w/Reflex         C3 and C4         C1 esterase inhibitor, functional         C1 Esterase Inhibitor         CBC with Differential/Platelet         Complement component c1q         C-reactive protein         Sedimentation rate         Tryptase         Thyroid Cascade Profile         Protein electrophoresis, serum         Protein Electrophoresis, Urine Rflx.         Comprehensive metabolic panel      Other allergy screening: Asthma: no Rhino conjunctivitis: mild symptoms in the spring and fall. Takes zyrtec prn with good benefit. Food allergy: no Limited red meat due to cholesterol.  No recent tick bites.  Medication allergy: yes Hymenoptera allergy: no Urticaria: no Eczema:no History of recurrent infections suggestive of immunodeficency: no  Diagnostics: Skin Testing: Deferred due to recent antihistamines use.  Past Medical History: Patient Active Problem List   Diagnosis Date Noted   Angio-edema 01/04/2023   Allergic reaction 01/04/2023   Coronary artery disease involving native coronary artery of native heart without angina pectoris 08/12/2022   Hyperlipidemia 09/14/2021   GERD (gastroesophageal reflux disease) 06/25/2021   Routine health maintenance 01/23/2014   Past Medical History:  Diagnosis Date   Angio-edema    GERD (gastroesophageal reflux disease)    Past Surgical History: Past Surgical History:  Procedure Laterality Date   NO PAST SURGERIES     Medication List:  Current Outpatient Medications  Medication Sig Dispense Refill   amLODipine (NORVASC) 10 MG tablet Take 10 mg by mouth daily.     cetirizine (ZYRTEC ALLERGY) 10 MG tablet Take 1 tablet (10 mg total) by mouth 2 (two) times daily.     EPINEPHrine 0.3 mg/0.3 mL IJ SOAJ injection Inject into the muscle.     famotidine (PEPCID) 20 MG tablet Take 1 tablet (20 mg total) by mouth 2 (two) times daily.     predniSONE (DELTASONE) 10 MG tablet Take 2 tablets (20 mg total) by  mouth daily. 10 tablet 0   nitroGLYCERIN (NITROSTAT) 0.4 MG SL tablet Place 1 tablet (0.4 mg total) under the tongue every 5 (five) minutes as needed for chest pain. Up to three times. 25 tablet 3   No current facility-administered medications for this visit.   Allergies: Allergies  Allergen Reactions   Clindamycin/Lincomycin Other (See Comments)    Pitchea    Chantix [Varenicline]     Other reaction(s): "made me crazy"   Naproxen     Other reaction(s): nausea   Zoloft [Sertraline] Nausea And Vomiting   Social History: Social History   Socioeconomic History   Marital status: Single    Spouse name: Not on  file   Number of children: Not on file   Years of education: Not on file   Highest education level: Not on file  Occupational History   Not on file  Tobacco Use   Smoking status: Former    Types: Cigarettes   Smokeless tobacco: Current    Types: Chew  Substance and Sexual Activity   Alcohol use: Never   Drug use: Never   Sexual activity: Not on file  Other Topics Concern   Not on file  Social History Narrative   Not on file   Social Determinants of Health   Financial Resource Strain: Low Risk  (08/13/2022)   Overall Financial Resource Strain (CARDIA)    Difficulty of Paying Living Expenses: Not very hard  Food Insecurity: No Food Insecurity (08/13/2022)   Hunger Vital Sign    Worried About Running Out of Food in the Last Year: Never true    Ran Out of Food in the Last Year: Never true  Transportation Needs: No Transportation Needs (08/13/2022)   PRAPARE - Hydrologist (Medical): No    Lack of Transportation (Non-Medical): No  Physical Activity: Not on file  Stress: Not on file  Social Connections: Not on file   Lives in a 50 year old house. Smoking: chew tobacco.  Occupation: manages own business  Environmental History: Water Damage/mildew in the house: no Charity fundraiser in the family room: no Carpet in the bedroom: no Heating:  gas Cooling: central Pet: no  Family History: Family History  Problem Relation Age of Onset   Other Mother        Accident   Heart disease Father    Heart disease Paternal Grandfather    Allergies Half-Brother    Review of Systems  Constitutional:  Negative for appetite change, chills, fever and unexpected weight change.  HENT:  Negative for congestion and rhinorrhea.        Angioedema of the tongue  Eyes:  Negative for itching.  Respiratory:  Negative for cough, chest tightness, shortness of breath and wheezing.   Cardiovascular:  Negative for chest pain.  Gastrointestinal:  Negative for abdominal pain.  Genitourinary:  Negative for difficulty urinating.  Skin:  Negative for rash.  Neurological:  Negative for headaches.    Objective: BP 132/80   Pulse 97   Temp 98.4 F (36.9 C)   Resp 18   Ht '6\' 2"'$  (1.88 m)   Wt 220 lb (99.8 kg)   SpO2 98%   BMI 28.25 kg/m  Body mass index is 28.25 kg/m. Physical Exam Vitals and nursing note reviewed.  Constitutional:      Appearance: Normal appearance. He is well-developed.  HENT:     Head: Normocephalic and atraumatic.     Comments: Slight left cheek edema.     Right Ear: Tympanic membrane and external ear normal.     Left Ear: Tympanic membrane and external ear normal.     Nose: Nose normal.     Mouth/Throat:     Mouth: Mucous membranes are moist.     Pharynx: Oropharynx is clear.  Eyes:     Conjunctiva/sclera: Conjunctivae normal.  Cardiovascular:     Rate and Rhythm: Normal rate and regular rhythm.     Heart sounds: Normal heart sounds. No murmur heard.    No friction rub. No gallop.  Pulmonary:     Effort: Pulmonary effort is normal.     Breath sounds: Normal breath sounds. No wheezing, rhonchi or rales.  Musculoskeletal:     Cervical back: Neck supple.  Skin:    General: Skin is warm.     Findings: No rash.  Neurological:     Mental Status: He is alert and oriented to person, place, and time.  Psychiatric:         Behavior: Behavior normal.   The plan was reviewed with the patient/family, and all questions/concerned were addressed.  It was my pleasure to see Larry Barber today and participate in his care. Please feel free to contact me with any questions or concerns.  Sincerely,  Rexene Alberts, DO Allergy & Immunology  [facility] office: [place] office: [phone]

## 2023-01-28 LAB — TRYPTASE: Tryptase: 9.4 ug/L (ref 2.2–13.2)

## 2023-01-28 LAB — C1 ESTERASE INHIBITOR, FUNCTIONAL: C1INH Functional/C1INH Total MFr SerPl: >93 %mean normal

## 2023-01-28 LAB — PROTEIN ELECTROPHORESIS, SERUM
A/G Ratio: 1 (ref 0.7–1.7)
Albumin ELP: 3.9 g/dL (ref 2.9–4.4)
Alpha 1: 0.2 g/dL (ref 0.0–0.4)
Alpha 2: 0.8 g/dL (ref 0.4–1.0)
Beta: 1.6 g/dL — ABNORMAL HIGH (ref 0.7–1.3)
Gamma Globulin: 1.2 g/dL (ref 0.4–1.8)
Globulin, Total: 3.9 g/dL (ref 2.2–3.9)

## 2023-01-28 LAB — PROTEIN ELECTROPHORESIS, URINE REFLEX
Albumin ELP, Urine: 82.4 %
Alpha-1-Globulin, U: 3.2 %
Alpha-2-Globulin, U: 2.9 %
Beta Globulin, U: 8.2 %
Gamma Globulin, U: 3.4 %
Protein, Ur: 94.9 mg/dL

## 2023-01-28 LAB — CBC WITH DIFFERENTIAL/PLATELET
Basophils Absolute: 0 10*3/uL (ref 0.0–0.2)
Basos: 0 %
EOS (ABSOLUTE): 0.3 10*3/uL (ref 0.0–0.4)
Eos: 3 %
Hematocrit: 45.4 % (ref 37.5–51.0)
Hemoglobin: 14.8 g/dL (ref 13.0–17.7)
Immature Grans (Abs): 0.1 10*3/uL (ref 0.0–0.1)
Immature Granulocytes: 1 %
Lymphocytes Absolute: 2.7 10*3/uL (ref 0.7–3.1)
Lymphs: 30 %
MCH: 27.8 pg (ref 26.6–33.0)
MCHC: 32.6 g/dL (ref 31.5–35.7)
MCV: 85 fL (ref 79–97)
Monocytes Absolute: 0.4 10*3/uL (ref 0.1–0.9)
Monocytes: 5 %
Neutrophils Absolute: 5.5 10*3/uL (ref 1.4–7.0)
Neutrophils: 61 %
Platelets: 306 10*3/uL (ref 150–450)
RBC: 5.33 x10E6/uL (ref 4.14–5.80)
RDW: 12.8 % (ref 11.6–15.4)
WBC: 9.1 10*3/uL (ref 3.4–10.8)

## 2023-01-28 LAB — COMPREHENSIVE METABOLIC PANEL
ALT: 52 IU/L — ABNORMAL HIGH (ref 0–44)
AST: 24 IU/L (ref 0–40)
Albumin/Globulin Ratio: 1.4 (ref 1.2–2.2)
Albumin: 4.6 g/dL (ref 4.1–5.1)
Alkaline Phosphatase: 68 IU/L (ref 44–121)
BUN/Creatinine Ratio: 13 (ref 9–20)
BUN: 15 mg/dL (ref 6–24)
Bilirubin Total: 0.3 mg/dL (ref 0.0–1.2)
CO2: 24 mmol/L (ref 20–29)
Calcium: 9.7 mg/dL (ref 8.7–10.2)
Chloride: 94 mmol/L — ABNORMAL LOW (ref 96–106)
Creatinine, Ser: 1.13 mg/dL (ref 0.76–1.27)
Globulin, Total: 3.2 g/dL (ref 1.5–4.5)
Glucose: 171 mg/dL — ABNORMAL HIGH (ref 70–99)
Potassium: 3.9 mmol/L (ref 3.5–5.2)
Sodium: 136 mmol/L (ref 134–144)
Total Protein: 7.8 g/dL (ref 6.0–8.5)
eGFR: 80 mL/min/{1.73_m2} (ref >59)

## 2023-01-28 LAB — ALPHA-GAL PANEL
Allergen Lamb IgE: <0.1 kU/L
Beef IgE: <0.1 kU/L
IgE (Immunoglobulin E), Serum: 62 IU/mL (ref 6–495)
O215-IgE Alpha-Gal: 0.3 kU/L — AB
Pork IgE: <0.1 kU/L

## 2023-01-28 LAB — ANA W/REFLEX: Anti Nuclear Antibody (ANA): NEGATIVE

## 2023-01-28 LAB — COMPLEMENT COMPONENT C1Q: Complement C1Q: 14 mg/dL (ref 10.2–20.3)

## 2023-01-28 LAB — C1 ESTERASE INHIBITOR: C1INH SerPl-mCnc: 23 mg/dL (ref 21–39)

## 2023-01-28 LAB — THYROID CASCADE PROFILE: TSH: 2.44 u[IU]/mL (ref 0.450–4.500)

## 2023-01-28 LAB — C-REACTIVE PROTEIN: CRP: 4 mg/L (ref 0–10)

## 2023-01-28 LAB — SEDIMENTATION RATE: Sed Rate: 26 mm/hr — ABNORMAL HIGH (ref 0–15)

## 2023-01-28 LAB — C3 AND C4
Complement C3, Serum: 202 mg/dL — ABNORMAL HIGH (ref 82–167)
Complement C4, Serum: 53 mg/dL — ABNORMAL HIGH (ref 12–38)

## 2023-02-02 NOTE — Progress Notes (Signed)
Follow Up Note  RE: Larry Barber MRN: [id number] DOB: 06/30/1971 Date of Office Visit: 02/03/2023  Referring provider: Merri Brunette, MD Primary care provider: Merri Brunette, MD  Chief Complaint: Follow-up  History of Present Illness: I had the pleasure of seeing Larry Barber for a follow up visit at the [facility] on 02/03/2023. He is a 52 y.o. male, who is being followed for angioedema. His previous allergy office visit was on 01/04/2023 with Dr. Selena Batten. Today is a regular follow up visit.  Angio-edema Patient has been taking zyrtec 10mg  QHS and famotidine 20mg  BID. The zyrtec does cause some drowsiness so he stopped doing BID dosing.   He has been avoiding beef and aspirin as well. No episodes since the last visit.  Patient still chewing tobacco. Used to smoke before. No recent dental visit. Denies fevers/chills, lymphadenopathy.   Last year had some proteinuria and saw nephrology for that. He used to drink heavily up until 3 years ago. Had issues with some elevated glucose in the past as well.  Assessment and Plan: Larry Barber is a 52 y.o. male with: Angio-edema Past history - 4 episodes of significant tongue angioedema and 2 facial edema since November 2023. Triggers unknown but responsive to steroids and antihistamines. Denies changes in diet, personal care products or recent infections. He started to aspirin more frequently since January. His blood pressure medication was switched from ARB to amlodipine. Chews tobacco and self-insured. Not on Ace-inh, no family history of angioedema. Interim history - stopped aspiring, red meat, taking zyrtec 10mg  QHS and famotidine 20mg  BID no additional episodes. Reviewed 2024 bloodwork in detail Alpha gal - borderline positive, C3 & C4 - elevated, Sed rate - elevated SPEP - elevated beta  CMP - slightly elevated ALT, glucose Following were normal: ANA, angioedema panel, CBC diff, crp, tryptase, TSH and UPEP Refer to  hematology/oncology Abnormal SPEP - elevated beta  At this time, I'm not sure what's causing these episodes still.  Keep track of episodes and take pictures. Write down what you had eaten or done that day. If you have another episode then okay to take prednisone 10mg  to 20mg  once a day and let us know.  Continue to avoid aspirin and red meat - no beef, pork, lamb.  Only take the amlodipine daily. No other over the counter supplements. Try to stop chewing tobacco. Recommend that you see your dentist for a check up.  You may decrease zyrtec (cetirizine) to 5mg  at night (1/2 tablet) If symptoms are not controlled or causes drowsiness let us know. Continue pepcid (famotidine) 20mg  twice a day.  Avoid the following potential triggers: alcohol, tight clothing, NSAIDs, hot showers and getting overheated. For mild symptoms you can take over the counter antihistamines such as Benadryl 1-2 tablets = 25-50mg .and monitor symptoms closely. If symptoms worsen or if you have severe symptoms including breathing issues, throat closure, significant swelling, whole body hives, severe diarrhea and vomiting, lightheadedness then inject epinephrine and seek immediate medical care afterwards. Emergency action plan in place.   Return in about 2 months (around 04/05/2023).  No orders of the defined types were placed in this encounter.  Lab Orders  No laboratory test(s) ordered today    Diagnostics: None.   Medication List:  Current Outpatient Medications  Medication Sig Dispense Refill   amLODipine (NORVASC) 10 MG tablet Take 10 mg by mouth daily.     cetirizine (ZYRTEC ALLERGY) 10 MG tablet Take 1 tablet (10 mg total) by mouth  2 (two) times daily.     EPINEPHrine 0.3 mg/0.3 mL IJ SOAJ injection Inject into the muscle.     famotidine (PEPCID) 20 MG tablet Take 1 tablet (20 mg total) by mouth 2 (two) times daily.     nitroGLYCERIN (NITROSTAT) 0.4 MG SL tablet Place 1 tablet (0.4 mg total) under the tongue  every 5 (five) minutes as needed for chest pain. Up to three times. 25 tablet 3   No current facility-administered medications for this visit.   Allergies: Allergies  Allergen Reactions   Clindamycin/Lincomycin Other (See Comments)    Pitchea    Chantix [Varenicline]     Other reaction(s): "made me crazy"   Naproxen     Other reaction(s): nausea   Zoloft [Sertraline] Nausea And Vomiting   I reviewed his past medical history, social history, family history, and environmental history and no significant changes have been reported from his previous visit.  Review of Systems  Constitutional:  Negative for appetite change, chills, fever and unexpected weight change.  HENT:  Negative for congestion and rhinorrhea.   Eyes:  Negative for itching.  Respiratory:  Negative for cough, chest tightness, shortness of breath and wheezing.   Cardiovascular:  Negative for chest pain.  Gastrointestinal:  Negative for abdominal pain.  Genitourinary:  Negative for difficulty urinating.  Skin:  Negative for rash.  Neurological:  Negative for headaches.    Objective: BP 102/70   Pulse 88   Temp 98.5 F (36.9 C)   Resp 16   Wt 218 lb 4 oz (99 kg)   SpO2 98%   BMI 28.02 kg/m  Body mass index is 28.02 kg/m. Physical Exam Vitals and nursing note reviewed.  Constitutional:      Appearance: Normal appearance. He is well-developed.  HENT:     Head: Normocephalic and atraumatic.     Right Ear: Tympanic membrane and external ear normal.     Left Ear: Tympanic membrane and external ear normal.     Nose: Nose normal.     Mouth/Throat:     Mouth: Mucous membranes are moist.     Pharynx: Oropharynx is clear.  Eyes:     Conjunctiva/sclera: Conjunctivae normal.  Cardiovascular:     Rate and Rhythm: Normal rate and regular rhythm.     Heart sounds: Normal heart sounds. No murmur heard.    No friction rub. No gallop.  Pulmonary:     Effort: Pulmonary effort is normal.     Breath sounds: Normal  breath sounds. No wheezing, rhonchi or rales.  Musculoskeletal:     Cervical back: Neck supple.  Skin:    General: Skin is warm.     Findings: No rash.  Neurological:     Mental Status: He is alert and oriented to person, place, and time.  Psychiatric:        Behavior: Behavior normal.   Previous notes and tests were reviewed. The plan was reviewed with the patient/family, and all questions/concerned were addressed.  It was my pleasure to see Keiven today and participate in his care. Please feel free to contact me with any questions or concerns.  Sincerely,  Wyline Mood, DO Allergy & Immunology  [facility] office: [phone] [facility] office: [phone]

## 2023-02-03 ENCOUNTER — Other Ambulatory Visit: Payer: Self-pay

## 2023-02-03 ENCOUNTER — Telehealth: Payer: Self-pay

## 2023-02-03 ENCOUNTER — Ambulatory Visit (INDEPENDENT_AMBULATORY_CARE_PROVIDER_SITE_OTHER): Payer: Self-pay | Admitting: Allergy

## 2023-02-03 ENCOUNTER — Encounter: Payer: Self-pay | Admitting: Allergy

## 2023-02-03 VITALS — BP 102/70 | HR 88 | Temp 98.5°F | Resp 16 | Wt 218.2 lb

## 2023-02-03 DIAGNOSIS — T783XXD Angioneurotic edema, subsequent encounter: Secondary | ICD-10-CM

## 2023-02-03 NOTE — Progress Notes (Signed)
Discussed at [place] on 02/03/2023.  Alpha gal - borderline positive, C3 & C4 - elevated, Sed rate - elevated  SPEP - elevated beta   CMP - slightly elevated ALT, glucose  Following were normal: ANA, angioedema panel, CBC diff, crp, tryptase, TSH and UPEP

## 2023-02-03 NOTE — Telephone Encounter (Signed)
Per Dr.Kim please refer to hematology/oncology for angioedema, abnormal SPEP. Thank you

## 2023-02-03 NOTE — Telephone Encounter (Signed)
Please refer to h

## 2023-02-03 NOTE — Assessment & Plan Note (Addendum)
Past history - 4 episodes of significant tongue angioedema and 2 facial edema since November 2023. Triggers unknown but responsive to steroids and antihistamines. Denies changes in diet, personal care products or recent infections. He started to aspirin more frequently since January. His blood pressure medication was switched from ARB to amlodipine. Chews tobacco and self-insured. Not on Ace-inh, no family history of angioedema. Interim history - stopped aspiring, red meat, taking zyrtec 10mg  QHS and famotidine 20mg  BID no additional episodes. Reviewed 2024 bloodwork in detail Alpha gal - borderline positive, C3 & C4 - elevated, Sed rate - elevated SPEP - elevated beta  CMP - slightly elevated ALT, glucose Following were normal: ANA, angioedema panel, CBC diff, crp, tryptase, TSH and UPEP Refer to hematology/oncology Abnormal SPEP - elevated beta  At this time, I'm not sure what's causing these episodes still.  Keep track of episodes and take pictures. Write down what you had eaten or done that day. If you have another episode then okay to take prednisone 10mg  to 20mg  once a day and let us know.  Continue to avoid aspirin and red meat - no beef, pork, lamb.  Only take the amlodipine daily. No other over the counter supplements. Try to stop chewing tobacco. Recommend that you see your dentist for a check up.  You may decrease zyrtec (cetirizine) to 5mg  at night (1/2 tablet) If symptoms are not controlled or causes drowsiness let us know. Continue pepcid (famotidine) 20mg  twice a day.  Avoid the following potential triggers: alcohol, tight clothing, NSAIDs, hot showers and getting overheated. For mild symptoms you can take over the counter antihistamines such as Benadryl 1-2 tablets = 25-50mg .and monitor symptoms closely. If symptoms worsen or if you have severe symptoms including breathing issues, throat closure, significant swelling, whole body hives, severe diarrhea and vomiting,  lightheadedness then inject epinephrine and seek immediate medical care afterwards. Emergency action plan in place.

## 2023-02-03 NOTE — Patient Instructions (Addendum)
Swelling Refer to hematology/oncology Abnormal SPEP - elevated beta  At this time, I'm not sure what's causing these episodes still.  Keep track of episodes and take pictures. Write down what you had eaten or done that day. If you have another episode then okay to take prednisone 10mg  to 20mg  once a day and let us know.  Continue to avoid aspiring and red meat - no beef, pork, lamb.  Only take the amlodipine daily. No other over the counter supplements. Try to stop chewing tobacco. Recommend that you see your dentist for a check up.   You may decrease zyrtec (cetirizine) to 5mg  at night (1/2 tablet) If symptoms are not controlled or causes drowsiness let us know. Continue pepcid (famotidine) 20mg  twice a day.  Avoid the following potential triggers: alcohol, tight clothing, NSAIDs, hot showers and getting overheated.  For mild symptoms you can take over the counter antihistamines such as Benadryl 1-2 tablets = 25-50mg .and monitor symptoms closely. If symptoms worsen or if you have severe symptoms including breathing issues, throat closure, significant swelling, whole body hives, severe diarrhea and vomiting, lightheadedness then inject epinephrine and seek immediate medical care afterwards. Emergency action plan in place.   Follow up in 2 months - after your Hematology/oncology visit.   Blood count, kidney function, electrolytes, thyroid, autoimmune screener, tryptase (checks for mast cell issues), urine were all normal which is great.   One of the liver function - ALT slightly elevated Glucose (sugar) - slightly elevated.  Alpha gal (checks for red meat allergy) - slightly elevated.  Sed rate - inflammation marker - slightly elevated.

## 2023-02-08 ENCOUNTER — Ambulatory Visit (INDEPENDENT_AMBULATORY_CARE_PROVIDER_SITE_OTHER): Payer: Self-pay | Admitting: Family

## 2023-02-08 ENCOUNTER — Encounter: Payer: Self-pay | Admitting: Family

## 2023-02-08 VITALS — BP 144/88 | HR 97 | Temp 98.3°F

## 2023-02-08 DIAGNOSIS — T783XXD Angioneurotic edema, subsequent encounter: Secondary | ICD-10-CM

## 2023-02-08 MED ORDER — PREDNISONE 10 MG PO TABS: ORAL_TABLET | ORAL | 0 refills | Status: AC

## 2023-02-08 NOTE — Progress Notes (Signed)
[address] [place] [address] Dept: [phone]  FOLLOW UP NOTE  Patient ID: Larry Barber, male    DOB: [birth year]  Age: 52 y.o. MRN: [id number] Date of Office Visit: 02/08/2023  Assessment  Chief Complaint: Angioedema (Right side of his face since swollen since November)  HPI Larry Barber is a 52 year old male who presents today for an acute visit of swelling.  He was last seen on February 03, 2023 by Dr. Maudie Mercury for angioedema.  He denies any new diagnosis or surgery since his last office visit.  He reports that this morning around 4 AM he started feeling a little sick but not nauseated.  This did not last long.  He reports that he normally feels this way before the swelling starts.  He started having swelling on his right lower lip. This has gotten better since he took prednisone 20 mg around 4 AM and Benadryl.  He still has swelling in his right jaw/ cheek region.  He reports that he has had swelling episodes going on since November. Four times the swelling has affected his tongue, 1 time the swelling occurred in his throat, and another time it occurred on the left side of his face.  He reports that prednisone in the past has helped with the swelling. He denies any difficulty breathing, swallowing, rashes, or hives.  He did decrease his Zyrtec to 5 mg as recommended at his last office visit.  Prior to that he was on Zyrtec 10 mg once a day.  He had previously been on Zyrtec 1 tablet twice a day and this caused drowsiness.  He reports that the only new things that he has had to eat is strawberry, but he has not eaten strawberry any of the other times that he has had swelling.  He also mentions that yesterday around 1 PM he had pumpkin protein mixed with [place] yogurt.  He does eat cereal almost every night.  He is still  taking amlodipine and avoids red meat due to his borderline positive alpha gal.  He is also not taking aspirin.  He does continue to take famotidine 20 mg twice a day and he  does continue to chew tobacco.  He denies any new medications, products, or bug bites.  He has not been using a journal when his reaction occur and reports that he will start doing this.  He  does have an EpiPen, it is up-to-date, and he knows how to use it.  He has not yet received an appointment with hematology/oncology due to his abnormal SPEP-elevated beta.  Instructed him to call our office if he does not hear anything.   Drug Allergies:  Allergies  Allergen Reactions   Clindamycin/Lincomycin Other (See Comments)    Pitchea    Chantix [Varenicline]     Other reaction(s): "made me crazy"   Naproxen     Other reaction(s): nausea   Zoloft [Sertraline] Nausea And Vomiting    Review of Systems: Review of Systems  Constitutional:  Negative for chills and fever.       Able to speak in full sentences  HENT:         Denies rhinorrhea, nasal congestion, and post nasal drip  Eyes:        Denies itchy watery eyes  Respiratory:  Negative for cough, shortness of breath and wheezing.   Cardiovascular:  Negative for chest pain and palpitations.  Gastrointestinal:  Negative for abdominal pain, diarrhea, nausea and vomiting.  Skin:  Negative for itching and rash.  Neurological:  Negative for headaches.     Physical Exam: BP (!) 144/88 (BP Location: Left Arm, Patient Position: Sitting, Cuff Size: Normal)   Pulse 97   Temp 98.3 F (36.8 C) (Temporal)   SpO2 98%    Physical Exam HENT:     Head: Normocephalic and atraumatic.     Comments: Pharynx normal. Eyes normal. Ears normal. Nose normal    Right Ear: Tympanic membrane, ear canal and external ear normal.     Left Ear: Tympanic membrane, ear canal and external ear normal.     Nose: Nose normal.     Mouth/Throat:     Mouth: Mucous membranes are moist.     Pharynx: Oropharynx is clear.  Eyes:     Conjunctiva/sclera: Conjunctivae normal.  Cardiovascular:     Rate and Rhythm: Regular rhythm. Tachycardia present.     Heart sounds:  Normal heart sounds.  Pulmonary:     Effort: Pulmonary effort is normal.     Breath sounds: Normal breath sounds.     Comments: Lungs clear to auscultation Musculoskeletal:     Cervical back: Neck supple.  Skin:    General: Skin is warm.     Comments: Swelling noted in right jaw/cheek region  Neurological:     Mental Status: He is alert and oriented to person, place, and time.  Psychiatric:        Mood and Affect: Mood normal.        Behavior: Behavior normal.        Thought Content: Thought content normal.        Judgment: Judgment normal.     Diagnostics:  None  Assessment and Plan: 1. Angioedema, subsequent encounter     Meds ordered this encounter  Medications   predniSONE (DELTASONE) 10 MG tablet    Sig: Starting 02/09/23 take 2 tablets twice a day for 1 day, then on the 2nd day take 2 tablets in the morning and on the 5th day take 1 tablet and stop    Dispense:  7 tablet    Refill:  0    Patient Instructions  Angioedema Past history - 4 episodes of significant tongue angioedema and 2 facial edema since November 2023. Triggers unknown but responsive to steroids and antihistamines. Denies changes in diet, personal care products or recent infections. He started to aspirin more frequently since January. His blood pressure medication was switched from ARB to amlodipine. Chews tobacco and self-insured. Not on Ace-inh, no family history of angioedema. Interim history - stopped aspirin, red meat, taking zyrtec 5 mg QHS and famotidine 20mg  BID. Episode this morning - tonight take another 20 mg tablet pf prednisone this evening -tomorrow (02/09/23) start the prescription for prednisone that I am sending to your pharmacy Reviewed 2024 bloodwork in detail Alpha gal - borderline positive, C3 & C4 - elevated, Sed rate - elevated SPEP - elevated beta  CMP - slightly elevated ALT, glucose Following were normal: ANA, angioedema panel, CBC diff, crp, tryptase, TSH and UPEP Refer to  hematology/oncology- waiting on appointment Abnormal SPEP - elevated beta  At this time, I'm not sure what's causing these episodes still.  Keep track of episodes and take pictures. Write down what you had eaten or done that day. If you have another episode then okay to take prednisone 10mg  to 20mg  once a day and let us know.  Continue to avoid aspirin and red meat - no beef, pork, lamb.  Only take  the amlodipine daily. No other over the counter supplements. Try to stop chewing tobacco. Recommend that you see your dentist for a check up.  Increase zyrtec (cetirizine) to 10 mg at night (1 tablet) If symptoms are not controlled or causes drowsiness let us know. Continue pepcid (famotidine) 20mg  twice a day.  Avoid the following potential triggers: alcohol, tight clothing, NSAIDs, hot showers and getting overheated. For mild symptoms you can take over the counter antihistamines such as Benadryl 1-2 tablets = 25-50mg .and monitor symptoms closely. If symptoms worsen or if you have severe symptoms including breathing issues, throat closure, significant swelling, whole body hives, severe diarrhea and vomiting, lightheadedness then inject epinephrine and seek immediate medical care afterwards. Emergency action plan in place.    Return in about 2-4 weeks.    Return in about 4 weeks (around 03/08/2023), or if symptoms worsen or fail to improve.    Thank you for the opportunity to care for this patient.  Please do not hesitate to contact me with questions.  Althea Charon, FNP [facility]

## 2023-02-08 NOTE — Patient Instructions (Addendum)
Angioedema Past history - 4 episodes of significant tongue angioedema and 2 facial edema since November 2023. Triggers unknown but responsive to steroids and antihistamines. Denies changes in diet, personal care products or recent infections. He started to aspirin more frequently since January. His blood pressure medication was switched from ARB to amlodipine. Chews tobacco and self-insured. Not on Ace-inh, no family history of angioedema. Interim history - stopped aspirin, red meat, taking zyrtec 5 mg QHS and famotidine 20mg  BID. Episode this morning - tonight take another 20 mg tablet pf prednisone this evening -tomorrow (02/09/23) start the prescription for prednisone that I am sending to your pharmacy Reviewed 2024 bloodwork in detail Alpha gal - borderline positive, C3 & C4 - elevated, Sed rate - elevated SPEP - elevated beta  CMP - slightly elevated ALT, glucose Following were normal: ANA, angioedema panel, CBC diff, crp, tryptase, TSH and UPEP Refer to hematology/oncology- waiting on appointment Abnormal SPEP - elevated beta  At this time, I'm not sure what's causing these episodes still.  Keep track of episodes and take pictures. Write down what you had eaten or done that day. If you have another episode then okay to take prednisone 10mg  to 20mg  once a day and let us know.  Continue to avoid aspirin and red meat - no beef, pork, lamb.  Only take the amlodipine daily. No other over the counter supplements. Try to stop chewing tobacco. Recommend that you see your dentist for a check up.  Increase zyrtec (cetirizine) to 10 mg at night (1 tablet) If symptoms are not controlled or causes drowsiness let us know. Continue pepcid (famotidine) 20mg  twice a day.  Avoid the following potential triggers: alcohol, tight clothing, NSAIDs, hot showers and getting overheated. For mild symptoms you can take over the counter antihistamines such as Benadryl 1-2 tablets = 25-50mg .and monitor symptoms  closely. If symptoms worsen or if you have severe symptoms including breathing issues, throat closure, significant swelling, whole body hives, severe diarrhea and vomiting, lightheadedness then inject epinephrine and seek immediate medical care afterwards. Emergency action plan in place.    Return in about 2-4 weeks.

## 2023-02-18 ENCOUNTER — Ambulatory Visit: Payer: Self-pay | Admitting: Cardiology

## 2023-03-01 ENCOUNTER — Ambulatory Visit: Payer: Self-pay | Admitting: Allergy

## 2023-05-04 NOTE — Progress Notes (Unsigned)
Cardiology Clinic Note   Patient Name: Larry Barber Date of Encounter: 05/05/2023  Primary Care Provider:  Merri Brunette, MD Primary Cardiologist:  Thomasene Ripple, DO  Patient Profile    Larry Barber 52 year old male presents to the clinic today for evaluation of his shortness of breath.  Past Medical History    Past Medical History:  Diagnosis Date   Angio-edema    GERD (gastroesophageal reflux disease)    Past Surgical History:  Procedure Laterality Date   NO PAST SURGERIES      Allergies  Allergies  Allergen Reactions   Clindamycin/Lincomycin Other (See Comments)    Pitchea    Chantix [Varenicline]     Other reaction(s): "made me crazy"   Naproxen     Other reaction(s): nausea   Zoloft [Sertraline] Nausea And Vomiting    History of Present Illness    Larry Barber has a PMH of coronary artery disease, hyperlipidemia, DOE, and palpitations.  He was initially seen by Dr. Servando Salina for evaluation of coronary artery disease.  His coronary CTA showed  calcium burden.  His coronary CTA results were reviewed.  He was seen again in follow-up 12/22.  He was reporting palpitations.  A prescription for propranolol was provided.  He was seen in follow-up by Dr. Servando Salina 10/23.  During that time he had stopped taking his propranolol and rosuvastatin.  Recommendations for restarting these medications were made.  He was agreeable to this.  He reported increased shortness of breath on 11/22/2022 during follow-up with Dr. Servando Salina.  His echocardiogram 12/01/2022 showed normal LVEF and intermediate diastolic parameters.  No significant valvular abnormalities were noted.  He presents to the clinic today for follow-up evaluation and states he has noticed some increased shortness of breath with increased physical activity.  He tries to remain physically active.  He enjoys playing golf.  We reviewed his rosuvastatin therapy and memory issues with the medication.  I discussed referring to  pharmacy lipid clinic and nuclear stress testing.  He indicates that he is in the process of getting insurance and will contact us to schedule stress testing.  Will plan follow-up after testing.  Today he denies chest pain,  lower extremity edema, fatigue, palpitations, melena, hematuria, hemoptysis, diaphoresis, weakness, presyncope, syncope, orthopnea, and PND.   Home Medications    Prior to Admission medications   Medication Sig Start Date End Date Taking? Authorizing Provider  amLODipine (NORVASC) 10 MG tablet Take 10 mg by mouth daily.    [provider]  cetirizine (ZYRTEC ALLERGY) 10 MG tablet Take 1 tablet (10 mg total) by mouth 2 (two) times daily. 01/04/23   Ellamae Sia, DO  EPINEPHrine 0.3 mg/0.3 mL IJ SOAJ injection Inject into the muscle. 09/19/22   [provider]  famotidine (PEPCID) 20 MG tablet Take 1 tablet (20 mg total) by mouth 2 (two) times daily. 01/04/23   Ellamae Sia, DO  nitroGLYCERIN (NITROSTAT) 0.4 MG SL tablet Place 1 tablet (0.4 mg total) under the tongue every 5 (five) minutes as needed for chest pain. Up to three times. 06/26/21 12/24/22  Tobb, Lavona Mound, DO  predniSONE (DELTASONE) 10 MG tablet Take 10 mg by mouth 2 (two) times daily with a meal.    [provider]  predniSONE (DELTASONE) 10 MG tablet Starting 02/09/23 take 2 tablets twice a day for 1 day, then on the 2nd day take 2 tablets in the morning and on the 5th day take 1 tablet and stop 02/08/23   Amada Jupiter,  Wynona Canes, FNP    Family History    Family History  Problem Relation Age of Onset   Other Mother        Accident   Heart disease Father    Heart disease Paternal Grandfather    Allergies Half-Brother    He indicated that his mother is deceased. He indicated that his father is deceased. He indicated that his brother is alive. He indicated that the status of his paternal grandfather is unknown. He indicated that his half-brother is alive.  Social History    Social History    Socioeconomic History   Marital status: Single    Spouse name: Not on file   Number of children: Not on file   Years of education: Not on file   Highest education level: Not on file  Occupational History   Not on file  Tobacco Use   Smoking status: Former    Types: Cigarettes   Smokeless tobacco: Current    Types: Chew  Substance and Sexual Activity   Alcohol use: Never   Drug use: Never   Sexual activity: Not on file  Other Topics Concern   Not on file  Social History Narrative   Not on file   Social Determinants of Health   Financial Resource Strain: Low Risk  (08/13/2022)   Overall Financial Resource Strain (CARDIA)    Difficulty of Paying Living Expenses: Not very hard  Food Insecurity: No Food Insecurity (08/13/2022)   Hunger Vital Sign    Worried About Running Out of Food in the Last Year: Never true    Ran Out of Food in the Last Year: Never true  Transportation Needs: No Transportation Needs (08/13/2022)   PRAPARE - Administrator, Civil Service (Medical): No    Lack of Transportation (Non-Medical): No  Physical Activity: Not on file  Stress: Not on file  Social Connections: Not on file  Intimate Partner Violence: Not on file     Review of Systems    General:  No chills, fever, night sweats or weight changes.  Cardiovascular:  No chest pain, dyspnea on exertion, edema, orthopnea, palpitations, paroxysmal nocturnal dyspnea. Dermatological: No rash, lesions/masses Respiratory: No cough, dyspnea Urologic: No hematuria, dysuria Abdominal:   No nausea, vomiting, diarrhea, bright red blood per rectum, melena, or hematemesis Neurologic:  No visual changes, wkns, changes in mental status. All other systems reviewed and are otherwise negative except as noted above.  Physical Exam    VS:  BP 120/78   Pulse 65   Ht 6\' 1"  (1.854 m)   Wt 211 lb 6.4 oz (95.9 kg)   SpO2 99%   BMI 27.89 kg/m  , BMI Body mass index is 27.89 kg/m. GEN: Well nourished,  well developed, in no acute distress. HEENT: normal. Neck: Supple, no JVD, carotid bruits, or masses. Cardiac: RRR, no murmurs, rubs, or gallops. No clubbing, cyanosis, edema.  Radials/DP/PT 2+ and equal bilaterally.  Respiratory:  Respirations regular and unlabored, clear to auscultation bilaterally. GI: Soft, nontender, nondistended, BS + x 4. MS: no deformity or atrophy. Skin: warm and dry, no rash. Neuro:  Strength and sensation are intact. Psych: Normal affect.  Accessory Clinical Findings    Recent Labs: 11/22/2022: Magnesium 1.8 01/20/2023: ALT 52; BUN 15; Creatinine, Ser 1.13; Hemoglobin 14.8; Platelets 306; Potassium 3.9; Sodium 136; TSH 2.440   Recent Lipid Panel    Component Value Date/Time   CHOL 147 11/22/2022 1038   TRIG 372 (H) 11/22/2022 1038  HDL 40 11/22/2022 1038   CHOLHDL 3.7 11/22/2022 1038   LDLCALC 51 11/22/2022 1038         ECG personally reviewed by me today- Sinus rhythm Anteroseptal infarct undetermined age 76 BPM - No acute changes   Coronary CTA 07/03/2021  FINDINGS: Coronary calcium score: The patient's coronary artery calcium score is 432, which places the patient in the 99th percentile.   Coronary arteries: Normal coronary origins.  Right dominance.   Right Coronary Artery: Normal caliber vessel, gives rise to PDA. There is focal mixed calcified and noncalcified plaque in the proximal RCA, with 25-49% stenosis. There is focal mixed calcified and noncalcified plaque in the mid RCA, with 25-49% stenosis. There is mixed calcified and noncalcified plaque in the distal RCA with 1-24% stenosis.   Left Main Coronary Artery: Normal caliber vessel. No significant plaque or stenosis.   Left Anterior Descending Coronary Artery: Normal caliber vessel. Diffused mixed calcified and noncalcified plaque. There is a focal mixed plaque at the origin of the LAD, with 1-24% stenosis. There is a focal calcified plaque in the mid LAD with 25-49% stenosis.  Gives rise to 2 diagonal branches.   Left Circumflex Artery: Normal caliber vessel. There is scattered noncalcified plaque without significant stenosis. Gives rise to 2 OM branches.   Aorta: Normal size, 30 mm at the mid ascending aorta (level of the PA bifurcation) measured double oblique. Aortic atherosclerosis. No dissection seen in visualized portions of the aorta.   Aortic Valve: No calcifications. Trileaflet.   Other findings:   Normal pulmonary vein drainage into the left atrium.   Normal left atrial appendage without a thrombus.   Normal size of the pulmonary artery.   Normal appearance of the pericardium.   Step artifact is present, but images are interpretable.   IMPRESSION: 1. At least mild nonobstructive CAD, CADRADS = 2. Cannot exclude flow limiting stenosis based on available image, so CT FFR will be performed and reported separately.   2. Coronary calcium score of 432. This was 99th percentile for age and sex matched control. Of note, prior calcium score 610, discrepancy likely due to difference between scanners.   3. Normal coronary origin with right dominance.   There is significant step artifact, with focal areas of the LAD, LCx, and RCA unable to be fully evaluated. The rest of the portions of the vessels are interpretable, but cannot evaluate for stenosis in the artifact-impacted sections.   INTERPRETATION:   1. CAD-RADS 0: No evidence of CAD (0%). Consider non-atherosclerotic causes of chest pain.   2. CAD-RADS 1: Minimal non-obstructive CAD (0-24%). Consider non-atherosclerotic causes of chest pain. Consider preventive therapy and risk factor modification.   3. CAD-RADS 2: Mild non-obstructive CAD (25-49%). Consider non-atherosclerotic causes of chest pain. Consider preventive therapy and risk factor modification.   4. CAD-RADS 3: Moderate stenosis (50-69%). Consider symptom-guided anti-ischemic pharmacotherapy as well as risk factor  modification per guideline directed care. Additional analysis with CT FFR will be submitted.   5. CAD-RADS 4: Severe stenosis. (70-99% or > 50% left main). Cardiac catheterization or CT FFR is recommended. Consider symptom-guided anti-ischemic pharmacotherapy as well as risk factor modification per guideline directed care. Invasive coronary angiography recommended with revascularization per published guideline statements.   6. CAD-RADS 5: Total coronary occlusion (100%). Consider cardiac catheterization or viability assessment. Consider symptom-guided anti-ischemic pharmacotherapy as well as risk factor modification per guideline directed care.   7. CAD-RADS N: Non-diagnostic study. Obstructive CAD can't be excluded. Alternative evaluation is recommended.  Electronically Signed   By: Jodelle Red M.D.   On: 07/03/2021 15:34     Assessment & Plan   1.  DOE, shortness of breath-notices increase of shortness of breath with increased physical activity.  Echocardiogram showed normal LVEF and intermediate diastolic parameters with no significant valvular abnormality. Order nuclear stress test-patient will call to schedule. May benefit from referral to pulmonology if stress testing is reassuring.  Contact PCP for recommendations on inhaler.  Informed Consent   Shared Decision Making/Informed Consent The risks [chest pain, shortness of breath, cardiac arrhythmias, dizziness, blood pressure fluctuations, myocardial infarction, stroke/transient ischemic attack, nausea, vomiting, allergic reaction, radiation exposure, metallic taste sensation and life-threatening complications (estimated to be 1 in 10,000)], benefits (risk stratification, diagnosing coronary artery disease, treatment guidance) and alternatives of a nuclear stress test were discussed in detail with Mr. Rosiles and he agrees to proceed.      Palpitations-denies increased or accelerated heart rate.  Notes  improvement with palpitations with propranolol. Avoid triggers caffeine, chocolate, EtOH, dehydration etc. Continue propranolol as needed  Coronary artery disease-no chest pain today.  Coronary CTA 07/03/2021 showed a coronary calcium score of 432.  Proximal RCA 25-49% stenosis, mid RCA 25-49% stenosis, distal RCA 1-24% stenosis, LAD showed diffuse mixed calcified and noncalcified plaque, mid LAD 25-49%.  Circumflex scattered noncalcified plaque without significant stenosis.  Mid ascending aorta 30 mm Restart rosuvastatin Continue amlodipine Heart healthy low-sodium high-fiber diet Order nuclear stress test  Hyperlipidemia-LDL 122 04/22/2023. Continue current medical therapy High-fiber diet Increase physical activity as tolerated Refer to pharmacy lipid clinic  Disposition: Follow-up with Dr. Servando Salina or me in 3-4 months.   Thomasene Ripple. Damyon Mullane NP-C     05/05/2023, 9:32 AM [facility] [address] Office [phone] Fax [phone]    I spent 14 minutes examining this patient, reviewing medications, and using patient centered shared decision making involving her cardiac care.  Prior to her visit I spent greater than 20 minutes reviewing her past medical history,  medications, and prior cardiac tests.

## 2023-05-05 ENCOUNTER — Ambulatory Visit: Payer: Self-pay | Attending: General Practice | Admitting: General Practice

## 2023-05-05 ENCOUNTER — Encounter: Payer: Self-pay | Admitting: General Practice

## 2023-05-05 VITALS — BP 120/78 | HR 65 | Ht 73.0 in | Wt 211.4 lb

## 2023-05-05 DIAGNOSIS — R002 Palpitations: Secondary | ICD-10-CM

## 2023-05-05 DIAGNOSIS — I2510 Atherosclerotic heart disease of native coronary artery without angina pectoris: Secondary | ICD-10-CM

## 2023-05-05 DIAGNOSIS — R9431 Abnormal electrocardiogram [ECG] [EKG]: Secondary | ICD-10-CM

## 2023-05-05 DIAGNOSIS — R0609 Other forms of dyspnea: Secondary | ICD-10-CM

## 2023-05-05 DIAGNOSIS — E785 Hyperlipidemia, unspecified: Secondary | ICD-10-CM

## 2023-05-05 NOTE — Patient Instructions (Signed)
Medication Instructions:  The current medical regimen is effective;  continue present plan and medications as directed. Please refer to the Current Medication list given to you today.  *If you need a refill on your cardiac medications before your next appointment, please call your pharmacy*   Lab Work: NONE ordered at this time of appointment    Testing/Procedures: LEXISCAN=Your physician has requested that you have a [employer], is a chemical stress test. Please follow instruction sheet, as given.  The Myoview Stress Test is a diagnostic test to evaluate/detect the presence of early heart disease, to assess your functional capacity, or to update the status of your coronary circulation following a cardiac event.    Other Instructions CALL DR Renne Crigler TO DISCUSS ALBUTEROL INHALER  Follow-Up: At [facility], you and your health needs are our priority.  As part of our continuing mission to provide you with exceptional heart care, we have created designated Provider Care Teams.  These Care Teams include your primary Cardiologist (physician) and Advanced Practice Providers (APPs -  Physician Assistants and Nurse Practitioners) who all work together to provide you with the care you need, when you need it.  Your next appointment:   AFTER TESTING   Provider:   Thomasene Ripple, DO or Edd Fabian, FNP-C

## 2023-05-18 ENCOUNTER — Telehealth (HOSPITAL_COMMUNITY): Payer: Self-pay | Admitting: *Deleted

## 2023-05-23 ENCOUNTER — Ambulatory Visit (HOSPITAL_COMMUNITY): Payer: Self-pay

## 2023-06-06 NOTE — Progress Notes (Deleted)
Cardiology Clinic Note   Patient Name: Larry Barber Date of Encounter: 06/06/2023  Primary Care Provider:  Merri Brunette, MD Primary Cardiologist:  Thomasene Ripple, DO  Patient Profile    Larry Barber 52 year old male presents to the clinic today for evaluation of his shortness of breath.  Past Medical History    Past Medical History:  Diagnosis Date   Angio-edema    GERD (gastroesophageal reflux disease)    Past Surgical History:  Procedure Laterality Date   NO PAST SURGERIES      Allergies  Allergies  Allergen Reactions   Clindamycin/Lincomycin Other (See Comments)    Pitchea    Chantix [Varenicline]     Other reaction(s): "made me crazy"   Naproxen     Other reaction(s): nausea   Zoloft [Sertraline] Nausea And Vomiting    History of Present Illness    Larry Barber has a PMH of coronary artery disease, hyperlipidemia, DOE, and palpitations.  He was initially seen by Dr. Servando Salina for evaluation of coronary artery disease.  His coronary CTA showed  calcium burden.  His coronary CTA results were reviewed.  He was seen again in follow-up 12/22.  He was reporting palpitations.  A prescription for propranolol was provided.  He was seen in follow-up by Dr. Servando Salina 10/23.  During that time he had stopped taking his propranolol and rosuvastatin.  Recommendations for restarting these medications were made.  He was agreeable to this.  He reported increased shortness of breath on 11/22/2022 during follow-up with Dr. Servando Salina.  His echocardiogram 12/01/2022 showed normal LVEF and intermediate diastolic parameters.  No significant valvular abnormalities were noted.  He presented to the clinic 05/05/23 for follow-up evaluation and stated he had noticed some increased shortness of breath with increased physical activity.  He tried to remain physically active.  He enjoyed playing golf.  We reviewed his rosuvastatin therapy and memory issues with the medication.  I discussed referring  to pharmacy lipid clinic and nuclear stress testing.  He indicated that he was in the process of getting insurance and would contact the cardiology office to schedule stress testing.  I planned follow-up after testing.  Stress testing was not completed.  He presents to the clinic today for follow-up evaluation and states***.    Today he denies chest pain,  lower extremity edema, fatigue, palpitations, melena, hematuria, hemoptysis, diaphoresis, weakness, presyncope, syncope, orthopnea, and PND.   Home Medications    Prior to Admission medications   Medication Sig Start Date End Date Taking? Authorizing Provider  amLODipine (NORVASC) 10 MG tablet Take 10 mg by mouth daily.    [provider]  cetirizine (ZYRTEC ALLERGY) 10 MG tablet Take 1 tablet (10 mg total) by mouth 2 (two) times daily. 01/04/23   Ellamae Sia, DO  EPINEPHrine 0.3 mg/0.3 mL IJ SOAJ injection Inject into the muscle. 09/19/22   [provider]  famotidine (PEPCID) 20 MG tablet Take 1 tablet (20 mg total) by mouth 2 (two) times daily. 01/04/23   Ellamae Sia, DO  nitroGLYCERIN (NITROSTAT) 0.4 MG SL tablet Place 1 tablet (0.4 mg total) under the tongue every 5 (five) minutes as needed for chest pain. Up to three times. 06/26/21 12/24/22  Tobb, Lavona Mound, DO  predniSONE (DELTASONE) 10 MG tablet Take 10 mg by mouth 2 (two) times daily with a meal.    [provider]  predniSONE (DELTASONE) 10 MG tablet Starting 02/09/23 take 2 tablets twice a day for 1 day, then on the  2nd day take 2 tablets in the morning and on the 5th day take 1 tablet and stop 02/08/23   Nehemiah Settle, FNP    Family History    Family History  Problem Relation Age of Onset   Other Mother        Accident   Heart disease Father    Heart disease Paternal Grandfather    Allergies Half-Brother    He indicated that his mother is deceased. He indicated that his father is deceased. He indicated that his brother is alive. He indicated that the  status of his paternal grandfather is unknown. He indicated that his half-brother is alive.  Social History    Social History   Socioeconomic History   Marital status: Single    Spouse name: Not on file   Number of children: Not on file   Years of education: Not on file   Highest education level: Not on file  Occupational History   Not on file  Tobacco Use   Smoking status: Former    Types: Cigarettes   Smokeless tobacco: Current    Types: Chew  Substance and Sexual Activity   Alcohol use: Never   Drug use: Never   Sexual activity: Not on file  Other Topics Concern   Not on file  Social History Narrative   Not on file   Social Determinants of Health   Financial Resource Strain: Low Risk  (08/13/2022)   Overall Financial Resource Strain (CARDIA)    Difficulty of Paying Living Expenses: Not very hard  Food Insecurity: No Food Insecurity (08/13/2022)   Hunger Vital Sign    Worried About Running Out of Food in the Last Year: Never true    Ran Out of Food in the Last Year: Never true  Transportation Needs: No Transportation Needs (08/13/2022)   PRAPARE - Administrator, Civil Service (Medical): No    Lack of Transportation (Non-Medical): No  Physical Activity: Not on file  Stress: Not on file  Social Connections: Unknown (03/11/2022)   Received from [facility], [facility]   Social Network    Social Network: Not on file  Intimate Partner Violence: Unknown (02/10/2022)   Received from [facility], [facility]   HITS    Physically Hurt: Not on file    Insult or Talk Down To: Not on file    Threaten Physical Harm: Not on file    Scream or Curse: Not on file     Review of Systems    General:  No chills, fever, night sweats or weight changes.  Cardiovascular:  No chest pain, dyspnea on exertion, edema, orthopnea, palpitations, paroxysmal nocturnal dyspnea. Dermatological: No rash, lesions/masses Respiratory: No cough, dyspnea Urologic: No hematuria,  dysuria Abdominal:   No nausea, vomiting, diarrhea, bright red blood per rectum, melena, or hematemesis Neurologic:  No visual changes, wkns, changes in mental status. All other systems reviewed and are otherwise negative except as noted above.  Physical Exam    VS:  There were no vitals taken for this visit. , BMI There is no height or weight on file to calculate BMI. GEN: Well nourished, well developed, in no acute distress. HEENT: normal. Neck: Supple, no JVD, carotid bruits, or masses. Cardiac: RRR, no murmurs, rubs, or gallops. No clubbing, cyanosis, edema.  Radials/DP/PT 2+ and equal bilaterally.  Respiratory:  Respirations regular and unlabored, clear to auscultation bilaterally. GI: Soft, nontender, nondistended, BS + x 4. MS: no deformity or atrophy. Skin: warm and  dry, no rash. Neuro:  Strength and sensation are intact. Psych: Normal affect.  Accessory Clinical Findings    Recent Labs: 11/22/2022: Magnesium 1.8 01/20/2023: ALT 52; BUN 15; Creatinine, Ser 1.13; Hemoglobin 14.8; Platelets 306; Potassium 3.9; Sodium 136; TSH 2.440   Recent Lipid Panel    Component Value Date/Time   CHOL 147 11/22/2022 1038   TRIG 372 (H) 11/22/2022 1038   HDL 40 11/22/2022 1038   CHOLHDL 3.7 11/22/2022 1038   LDLCALC 51 11/22/2022 1038    No BP recorded.  {Refresh Note OR Click here to enter BP  :1}***    ECG personally reviewed by me today-none today.  EKG 05/05/2023 Sinus rhythm Anteroseptal infarct undetermined age 76 BPM - No acute changes   Coronary CTA 07/03/2021  FINDINGS: Coronary calcium score: The patient's coronary artery calcium score is 432, which places the patient in the 99th percentile.   Coronary arteries: Normal coronary origins.  Right dominance.   Right Coronary Artery: Normal caliber vessel, gives rise to PDA. There is focal mixed calcified and noncalcified plaque in the proximal RCA, with 25-49% stenosis. There is focal mixed calcified and noncalcified  plaque in the mid RCA, with 25-49% stenosis. There is mixed calcified and noncalcified plaque in the distal RCA with 1-24% stenosis.   Left Main Coronary Artery: Normal caliber vessel. No significant plaque or stenosis.   Left Anterior Descending Coronary Artery: Normal caliber vessel. Diffused mixed calcified and noncalcified plaque. There is a focal mixed plaque at the origin of the LAD, with 1-24% stenosis. There is a focal calcified plaque in the mid LAD with 25-49% stenosis. Gives rise to 2 diagonal branches.   Left Circumflex Artery: Normal caliber vessel. There is scattered noncalcified plaque without significant stenosis. Gives rise to 2 OM branches.   Aorta: Normal size, 30 mm at the mid ascending aorta (level of the PA bifurcation) measured double oblique. Aortic atherosclerosis. No dissection seen in visualized portions of the aorta.   Aortic Valve: No calcifications. Trileaflet.   Other findings:   Normal pulmonary vein drainage into the left atrium.   Normal left atrial appendage without a thrombus.   Normal size of the pulmonary artery.   Normal appearance of the pericardium.   Step artifact is present, but images are interpretable.   IMPRESSION: 1. At least mild nonobstructive CAD, CADRADS = 2. Cannot exclude flow limiting stenosis based on available image, so CT FFR will be performed and reported separately.   2. Coronary calcium score of 432. This was 99th percentile for age and sex matched control. Of note, prior calcium score 610, discrepancy likely due to difference between scanners.   3. Normal coronary origin with right dominance.   There is significant step artifact, with focal areas of the LAD, LCx, and RCA unable to be fully evaluated. The rest of the portions of the vessels are interpretable, but cannot evaluate for stenosis in the artifact-impacted sections.   INTERPRETATION:   1. CAD-RADS 0: No evidence of CAD (0%). Consider  non-atherosclerotic causes of chest pain.   2. CAD-RADS 1: Minimal non-obstructive CAD (0-24%). Consider non-atherosclerotic causes of chest pain. Consider preventive therapy and risk factor modification.   3. CAD-RADS 2: Mild non-obstructive CAD (25-49%). Consider non-atherosclerotic causes of chest pain. Consider preventive therapy and risk factor modification.   4. CAD-RADS 3: Moderate stenosis (50-69%). Consider symptom-guided anti-ischemic pharmacotherapy as well as risk factor modification per guideline directed care. Additional analysis with CT FFR will be submitted.   5. CAD-RADS  4: Severe stenosis. (70-99% or > 50% left main). Cardiac catheterization or CT FFR is recommended. Consider symptom-guided anti-ischemic pharmacotherapy as well as risk factor modification per guideline directed care. Invasive coronary angiography recommended with revascularization per published guideline statements.   6. CAD-RADS 5: Total coronary occlusion (100%). Consider cardiac catheterization or viability assessment. Consider symptom-guided anti-ischemic pharmacotherapy as well as risk factor modification per guideline directed care.   7. CAD-RADS N: Non-diagnostic study. Obstructive CAD can't be excluded. Alternative evaluation is recommended.     Electronically Signed   By: Jodelle Red M.D.   On: 07/03/2021 15:34     Assessment & Plan   1.  DOE, shortness of breath-breathing stabl***e.  Previously noticed increase of shortness of breath with increased physical activity.  Echocardiogram 12/01/2022 showed normal LVEF and intermediate diastolic parameters with no significant valvular abnormality.  During previous visit I explained and ordered nuclear stress testing.  He wished to defer stress testing until insurance was in placed. I felt he may benefit from referral to pulmonology if stress testing was reassuring.  Contact PCP for recommendations on inhaler.  Informed Consent   *** Shared Decision Making/Informed Consent The risks [chest pain, shortness of breath, cardiac arrhythmias, dizziness, blood pressure fluctuations, myocardial infarction, stroke/transient ischemic attack, nausea, vomiting, allergic reaction, radiation exposure, metallic taste sensation and life-threatening complications (estimated to be 1 in 10,000)], benefits (risk stratification, diagnosing coronary artery disease, treatment guidance) and alternatives of a nuclear stress test were discussed in detail with Mr. Bladow and he agrees to proceed.     Coronary artery disease-continues to deny chest pain.  Coronary CTA 07/03/2021 showed a coronary calcium score of 432.  Proximal RCA 25-49% stenosis, mid RCA 25-49% stenosis, distal RCA 1-24% stenosis, LAD showed diffuse mixed calcified and noncalcified plaque, mid LAD 25-49%.  Circumflex scattered noncalcified plaque without significant stenosis.  Mid ascending aorta 30 mm Continue rosuvastatin, amlodipine Heart healthy low-sodium high-fiber diet  Palpitations-stable.  Tolerating propranolol well. Avoid triggers caffeine, chocolate, EtOH, dehydration etc. Continue propranolol as needed  Hyperlipidemia-LDL 12***2 04/22/2023. Continue current medical therapy High-fiber diet Increase physical activity as tolerated Refer to pharmacy lipid clinic-reviewed  Disposition: Follow-up with Dr. Servando Salina or me in 3-4 months.   Thomasene Ripple.  NP-C     06/06/2023, 11:54 AM [facility] [address] Office [phone] Fax [phone]    I spent 14***minutes examining this patient, reviewing medications, and using patient centered shared decision making involving her cardiac care.  Prior to her visit I spent greater than 20 minutes reviewing her past medical history,  medications, and prior cardiac tests.

## 2023-06-07 ENCOUNTER — Ambulatory Visit: Payer: Self-pay | Attending: General Practice | Admitting: General Practice

## 2023-08-03 NOTE — Telephone Encounter (Signed)
err

## 2023-09-29 ENCOUNTER — Encounter: Payer: Self-pay | Admitting: Internal Medicine

## 2023-09-29 ENCOUNTER — Institutional Professional Consult (permissible substitution): Payer: Self-pay | Admitting: Internal Medicine

## 2024-06-25 NOTE — Progress Notes (Unsigned)
 Cardiology Clinic Note   Patient Name: Ilan Kahrs Date of Encounter: 06/27/2024  Primary Care Provider:  Clarice Nottingham, MD Primary Cardiologist:  Kardie Tobb, DO  Patient Profile    Ezel Vallone 53 year old male presents to the clinic today for evaluation of his shortness of breath.  Past Medical History    Past Medical History:  Diagnosis Date   Angio-edema    GERD (gastroesophageal reflux disease)    Past Surgical History:  Procedure Laterality Date   NO PAST SURGERIES      Allergies  Allergies  Allergen Reactions   Clindamycin/Lincomycin Other (See Comments)    Pitchea    Chantix [Varenicline]     Other reaction(s): made me crazy   Naproxen     Other reaction(s): nausea   Zoloft [Sertraline] Nausea And Vomiting    History of Present Illness    Byran Bilotti has a PMH of coronary artery disease, hyperlipidemia, DOE, and palpitations.  He was initially seen by Dr. Sheena for evaluation of coronary artery disease.  His coronary CTA showed  calcium  burden.  His coronary CTA results were reviewed.  He was seen again in follow-up 12/22.  He was reporting palpitations.  A prescription for propranolol  was provided.  He was seen in follow-up by Dr. Sheena 10/23.  During that time he had stopped taking his propranolol  and rosuvastatin.  Recommendations for restarting these medications were made.  He was agreeable to this.  He reported increased shortness of breath on 11/22/2022 during follow-up with Dr. Sheena.  His echocardiogram 12/01/2022 showed normal LVEF and intermediate diastolic parameters.  No significant valvular abnormalities were noted.  He presented to the clinic 05/05/23 for follow-up evaluation and stated he had noticed some increased shortness of breath with increased physical activity.  He tried to remain physically active.  He enjoyed playing golf.  We reviewed his rosuvastatin therapy and memory issues with the medication.  I discussed referring  to pharmacy lipid clinic and nuclear stress testing.  He indicated that he is in the process of getting insurance and would contact us  to schedule stress testing.  Will plan follow-up after testing.  He presents to the clinic today for follow-up evaluation and states he continues to note increased work of breathing.  He feels that it has gotten worse since he was seen in the clinic last.  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  He was recently seen by his PCP who drew lab work.  His total cholesterol was 260, HDL 36, LDL 120 and triglycerides were 584.  Of note he previously did not tolerate rosuvastatin or atorvastatin .  I recommended that he start on fenofibrate  for triglyceride reduction.  We also discussed the importance of increasing the fiber in his diet.  I will order a echocardiogram for his DOE and also refer to pulmonology for possible PFTs.  He does note that he smoked for around 20 years and grew up in a smoking environment.  He also reports that he has been having intermittent episodes of swelling.  He was seen and evaluated by an allergist who was unsure of what the cause was.  He plans to be evaluated by [facility].  His blood pressure is well-controlled today.  I will also have him start using incentive spirometer twice daily.  I will refer to pharmacy lipid clinic and plan follow-up in 3 to 4 months.  Today he denies chest pain,  lower extremity edema, fatigue,  palpitations, melena, hematuria, hemoptysis, diaphoresis, weakness, presyncope, syncope, orthopnea, and PND.   Home Medications    Prior to Admission medications   Medication Sig Start Date End Date Taking? Authorizing Provider  amLODipine (NORVASC) 10 MG tablet Take 10 mg by mouth daily.    [provider]  cetirizine  (ZYRTEC  ALLERGY) 10 MG tablet Take 1 tablet (10 mg total) by mouth 2 (two) times daily. 01/04/23   Luke Orlan HERO, DO  EPINEPHrine 0.3 mg/0.3 mL IJ SOAJ injection  Inject into the muscle. 09/19/22   [provider]  famotidine  (PEPCID ) 20 MG tablet Take 1 tablet (20 mg total) by mouth 2 (two) times daily. 01/04/23   Luke Orlan HERO, DO  nitroGLYCERIN  (NITROSTAT ) 0.4 MG SL tablet Place 1 tablet (0.4 mg total) under the tongue every 5 (five) minutes as needed for chest pain. Up to three times. 06/26/21 12/24/22  Tobb, Kardie, DO  predniSONE  (DELTASONE ) 10 MG tablet Take 10 mg by mouth 2 (two) times daily with a meal.    [provider]  predniSONE  (DELTASONE ) 10 MG tablet Starting 02/09/23 take 2 tablets twice a day for 1 day, then on the 2nd day take 2 tablets in the morning and on the 5th day take 1 tablet and stop 02/08/23   Cheryl Reusing, FNP    Family History    Family History  Problem Relation Age of Onset   Other Mother        Accident   Heart disease Father    Heart disease Paternal Grandfather    Allergies Half-Brother    He indicated that his mother is deceased. He indicated that his father is deceased. He indicated that his brother is alive. He indicated that the status of his paternal grandfather is unknown. He indicated that his half-brother is alive.  Social History    Social History   Socioeconomic History   Marital status: Single    Spouse name: Not on file   Number of children: Not on file   Years of education: Not on file   Highest education level: Not on file  Occupational History   Not on file  Tobacco Use   Smoking status: Former    Types: Cigarettes   Smokeless tobacco: Current    Types: Chew  Substance and Sexual Activity   Alcohol use: Never   Drug use: Never   Sexual activity: Not on file  Other Topics Concern   Not on file  Social History Narrative   Not on file   Social Drivers of Health   Financial Resource Strain: Low Risk  (08/13/2022)   Overall Financial Resource Strain (CARDIA)    Difficulty of Paying Living Expenses: Not very hard  Food Insecurity: No Food Insecurity (08/13/2022)    Hunger Vital Sign    Worried About Running Out of Food in the Last Year: Never true    Ran Out of Food in the Last Year: Never true  Transportation Needs: No Transportation Needs (08/13/2022)   PRAPARE - Administrator, Civil Service (Medical): No    Lack of Transportation (Non-Medical): No  Physical Activity: Not on file  Stress: Not on file  Social Connections: Unknown (03/11/2022)   Received from [facility]   Social Network    Social Network: Not on file  Intimate Partner Violence: Unknown (02/10/2022)   Received from [facility]   HITS    Physically Hurt: Not on file    Insult or Talk Down To: Not on  file    Threaten Physical Harm: Not on file    Scream or Curse: Not on file     Review of Systems    General:  No chills, fever, night sweats or weight changes.  Cardiovascular:  No chest pain, dyspnea on exertion, edema, orthopnea, palpitations, paroxysmal nocturnal dyspnea. Dermatological: No rash, lesions/masses Respiratory: No cough, dyspnea Urologic: No hematuria, dysuria Abdominal:   No nausea, vomiting, diarrhea, bright red blood per rectum, melena, or hematemesis Neurologic:  No visual changes, wkns, changes in mental status. All other systems reviewed and are otherwise negative except as noted above.  Physical Exam    VS:  BP 124/72 (BP Location: Right Arm)   Pulse 90   Ht 6' 2 (1.88 m)   Wt 221 lb 12.8 oz (100.6 kg)   SpO2 98%   BMI 28.48 kg/m  , BMI Body mass index is 28.48 kg/m. GEN: Well nourished, well developed, in no acute distress. HEENT: normal. Neck: Supple, no JVD, carotid bruits, or masses. Cardiac: RRR, no murmurs, rubs, or gallops. No clubbing, cyanosis, edema.  Radials/DP/PT 2+ and equal bilaterally.  Respiratory:  Respirations regular and unlabored, clear to auscultation bilaterally. GI: Soft, nontender, nondistended, BS + x 4. MS: no deformity or atrophy. Skin: warm and dry, no rash. Neuro:  Strength and sensation are  intact. Psych: Normal affect.  Accessory Clinical Findings    Recent Labs: No results found for requested labs within last 365 days.   Recent Lipid Panel    Component Value Date/Time   CHOL 147 11/22/2022 1038   TRIG 372 (H) 11/22/2022 1038   HDL 40 11/22/2022 1038   CHOLHDL 3.7 11/22/2022 1038   LDLCALC 51 11/22/2022 1038         ECG personally reviewed by me today- EKG Interpretation Date/Time:  Wednesday June 27 2024 09:48:03 EDT Ventricular Rate:  90 PR Interval:  194 QRS Duration:  82 QT Interval:  354 QTC Calculation: 433 R Axis:   62  Text Interpretation: Normal sinus rhythm Septal infarct (cited on or before 05-May-2023) When compared with ECG of 05-May-2023 08:57, No significant change was found Confirmed by Emelia Hazy [phone]) on 06/27/2024 10:01:55 AM   EKG 05/05/2023 Sinus rhythm Anteroseptal infarct undetermined age 68 BPM - No acute changes   Coronary CTA 07/03/2021  FINDINGS: Coronary calcium  score: The patient's coronary artery calcium  score is 432, which places the patient in the 99th percentile.   Coronary arteries: Normal coronary origins.  Right dominance.   Right Coronary Artery: Normal caliber vessel, gives rise to PDA. There is focal mixed calcified and noncalcified plaque in the proximal RCA, with 25-49% stenosis. There is focal mixed calcified and noncalcified plaque in the mid RCA, with 25-49% stenosis. There is mixed calcified and noncalcified plaque in the distal RCA with 1-24% stenosis.   Left Main Coronary Artery: Normal caliber vessel. No significant plaque or stenosis.   Left Anterior Descending Coronary Artery: Normal caliber vessel. Diffused mixed calcified and noncalcified plaque. There is a focal mixed plaque at the origin of the LAD, with 1-24% stenosis. There is a focal calcified plaque in the mid LAD with 25-49% stenosis. Gives rise to 2 diagonal branches.   Left Circumflex Artery: Normal caliber vessel. There is  scattered noncalcified plaque without significant stenosis. Gives rise to 2 OM branches.   Aorta: Normal size, 30 mm at the mid ascending aorta (level of the PA bifurcation) measured double oblique. Aortic atherosclerosis. No dissection seen in visualized portions of the  aorta.   Aortic Valve: No calcifications. Trileaflet.   Other findings:   Normal pulmonary vein drainage into the left atrium.   Normal left atrial appendage without a thrombus.   Normal size of the pulmonary artery.   Normal appearance of the pericardium.   Step artifact is present, but images are interpretable.   IMPRESSION: 1. At least mild nonobstructive CAD, CADRADS = 2. Cannot exclude flow limiting stenosis based on available image, so CT FFR will be performed and reported separately.   2. Coronary calcium  score of 432. This was 99th percentile for age and sex matched control. Of note, prior calcium  score 610, discrepancy likely due to difference between scanners.   3. Normal coronary origin with right dominance.   There is significant step artifact, with focal areas of the LAD, LCx, and RCA unable to be fully evaluated. The rest of the portions of the vessels are interpretable, but cannot evaluate for stenosis in the artifact-impacted sections.   INTERPRETATION:   1. CAD-RADS 0: No evidence of CAD (0%). Consider non-atherosclerotic causes of chest pain.   2. CAD-RADS 1: Minimal non-obstructive CAD (0-24%). Consider non-atherosclerotic causes of chest pain. Consider preventive therapy and risk factor modification.   3. CAD-RADS 2: Mild non-obstructive CAD (25-49%). Consider non-atherosclerotic causes of chest pain. Consider preventive therapy and risk factor modification.   4. CAD-RADS 3: Moderate stenosis (50-69%). Consider symptom-guided anti-ischemic pharmacotherapy as well as risk factor modification per guideline directed care. Additional analysis with CT FFR will be submitted.   5.  CAD-RADS 4: Severe stenosis. (70-99% or > 50% left main). Cardiac catheterization or CT FFR is recommended. Consider symptom-guided anti-ischemic pharmacotherapy as well as risk factor modification per guideline directed care. Invasive coronary angiography recommended with revascularization per published guideline statements.   6. CAD-RADS 5: Total coronary occlusion (100%). Consider cardiac catheterization or viability assessment. Consider symptom-guided anti-ischemic pharmacotherapy as well as risk factor modification per guideline directed care.   7. CAD-RADS N: Non-diagnostic study. Obstructive CAD can't be excluded. Alternative evaluation is recommended.     Electronically Signed   By: Shelda Bruckner M.D.   On: 07/03/2021 15:34     Assessment & Plan   1.  DOE, shortness of breath-he continues to notice  shortness of breath with increased physical activity.  He feels that this is stable compared to how he felt in June 2024.  His echocardiogram showed normal LVEF and intermediate diastolic parameters with no significant valvular abnormality.  Is somewhat physically active golfing 3 times per week. Order echocardiogram Use incentive spirometer Refer to pulmonology   Coronary artery disease-no chest pain today.  Coronary CTA 07/03/2021 showed a coronary calcium  score of 432.  Proximal RCA 25-49% stenosis, mid RCA 25-49% stenosis, distal RCA 1-24% stenosis, LAD showed diffuse mixed calcified and noncalcified plaque, mid LAD 25-49%.  Circumflex scattered noncalcified plaque without significant stenosis.  Mid ascending aorta 30 mm Continue amlodipine Heart healthy low-sodium high-fiber diet-reviewed  Palpitations-stable.  Continues to tolerate propranolol  well.   Avoid triggers caffeine, chocolate, EtOH, dehydration etc.-reviewed Continue propranolol  as needed  Hyperlipidemia-LDL 122 04/22/2023.  LDL 120 on 04/24/2024, triglycerides 584 on 04/24/2024.   Increase physical  activity as tolerated Start fenofibrate  40 mg daily Refer to pharmacy lipid clinic Plan for repeat fasting lipids and LFTs in 6 weeks  Disposition: Follow-up with Dr. Sheena or me in 3-4 months.   Josefa HERO. Sy Saintjean NP-C     06/27/2024, 10:02 AM [facility] [address]  [address] [place], [place] [address] Office [phone]      I spent 14 minutes examining this patient, reviewing medications, and using patient centered shared decision making involving her cardiac care.  Prior to her visit I spent greater than 20 minutes reviewing her past medical history,  medications, and prior cardiac tests.

## 2024-06-27 ENCOUNTER — Encounter: Payer: Self-pay | Admitting: General Practice

## 2024-06-27 ENCOUNTER — Ambulatory Visit: Payer: Self-pay | Attending: Cardiology | Admitting: General Practice

## 2024-06-27 VITALS — BP 124/72 | HR 90 | Ht 74.0 in | Wt 221.8 lb

## 2024-06-27 DIAGNOSIS — I2510 Atherosclerotic heart disease of native coronary artery without angina pectoris: Secondary | ICD-10-CM

## 2024-06-27 DIAGNOSIS — R0609 Other forms of dyspnea: Secondary | ICD-10-CM

## 2024-06-27 DIAGNOSIS — R002 Palpitations: Secondary | ICD-10-CM

## 2024-06-27 DIAGNOSIS — E782 Mixed hyperlipidemia: Secondary | ICD-10-CM

## 2024-06-27 MED ORDER — FENOFIBRATE 40 MG PO TABS: 40.0000 mg | ORAL_TABLET | Freq: Every day | ORAL | 3 refills | Status: DC

## 2024-06-27 MED ORDER — NITROGLYCERIN 0.4 MG SL SUBL: 0.4000 mg | SUBLINGUAL_TABLET | SUBLINGUAL | 5 refills | Status: AC | PRN

## 2024-06-27 NOTE — Patient Instructions (Signed)
 Medication Instructions:  Your physician has recommended you make the following change in your medication:  START FENOFIBRATE  40 MG DAILY.   *If you need a refill on your cardiac medications before your next appointment, please call your pharmacy*  Lab Work: TO BE DONE IN 6 WEEKS: LFTS, LIPIDS If you have labs (blood work) drawn today and your tests are completely normal, you will receive your results only by: MyChart Message (if you have MyChart) OR A paper copy in the mail If you have any lab test that is abnormal or we need to change your treatment, we will call you to review the results.  Testing/Procedures: Your physician has requested that you have an echocardiogram. Echocardiography is a painless test that uses sound waves to create images of your heart. It provides your doctor with information about the size and shape of your heart and how well your heart's chambers and valves are working. This procedure takes approximately one hour. There are no restrictions for this procedure. Please do NOT wear cologne, perfume, aftershave, or lotions (deodorant is allowed). Please arrive 15 minutes prior to your appointment time.  Please note: We ask at that you not bring children with you during ultrasound (echo/ vascular) testing. Due to room size and safety concerns, children are not allowed in the ultrasound rooms during exams. Our front office staff cannot provide observation of children in our lobby area while testing is being conducted. An adult accompanying a patient to their appointment will only be allowed in the ultrasound room at the discretion of the ultrasound technician under special circumstances. We apologize for any inconvenience.   Follow-Up: At [facility], you and your health needs are our priority.  As part of our continuing mission to provide you with exceptional heart care, our providers are all part of one team.  This team includes your primary Cardiologist  (physician) and Advanced Practice Providers or APPs (Physician Assistants and Nurse Practitioners) who all work together to provide you with the care you need, when you need it.  Your next appointment:   3-4 month(s)  Provider:   Kardie Tobb, DO or Josefa Beauvais, PA-C

## 2024-07-03 ENCOUNTER — Ambulatory Visit: Payer: Self-pay

## 2024-08-07 ENCOUNTER — Ambulatory Visit: Payer: Self-pay | Admitting: General Practice

## 2024-08-07 ENCOUNTER — Ambulatory Visit (HOSPITAL_COMMUNITY)
Admission: RE | Admit: 2024-08-07 | Discharge: 2024-08-07 | Disposition: A | Discharge status: Home / Self Care | Payer: Self-pay | Source: Ambulatory Visit | Attending: Internal Medicine | Admitting: Internal Medicine

## 2024-08-07 ENCOUNTER — Encounter: Payer: Self-pay | Admitting: Pharmacist Clinician (PhC)/ Clinical Pharmacy Specialist

## 2024-08-07 ENCOUNTER — Ambulatory Visit (INDEPENDENT_AMBULATORY_CARE_PROVIDER_SITE_OTHER): Payer: Self-pay | Admitting: Pharmacist Clinician (PhC)/ Clinical Pharmacy Specialist

## 2024-08-07 DIAGNOSIS — E782 Mixed hyperlipidemia: Secondary | ICD-10-CM | POA: Insufficient documentation

## 2024-08-07 DIAGNOSIS — R0609 Other forms of dyspnea: Secondary | ICD-10-CM | POA: Insufficient documentation

## 2024-08-07 LAB — ECHOCARDIOGRAM COMPLETE
Area-P 1/2: 2.56 cm2
S' Lateral: 2.9 cm

## 2024-08-07 MED ORDER — FENOFIBRATE 54 MG PO TABS: 54.0000 mg | ORAL_TABLET | Freq: Every day | ORAL | 3 refills | Status: AC

## 2024-08-07 MED ORDER — REPATHA SURECLICK 140 MG/ML ~~LOC~~ SOAJ: 140.0000 mg | SUBCUTANEOUS | 0 refills | Status: AC

## 2024-08-07 NOTE — Assessment & Plan Note (Signed)
 Assessment: Patient with ASCVD not at LDL goal of < 70 Most recent LDL 120 in June 2025 Triglycerides elevated at 584, currently on fenofibrate  40 mg, but paying ~$100/month Uninsured, is looking to start [employer] on Dec 1. Not able to tolerate statins secondary to brain fog/memory concerns Reviewed options for lowering LDL cholesterol, including PCSK-9 inhibitors and inclisiran.  Discussed mechanisms of action, dosing, side effects, potential decreases in LDL cholesterol and costs.  Also reviewed potential options for patient assistance. Patient previously tried low dose statin twice weekly and was unable to tolerate.    Plan: Switched fenofibrate  to 54 mg dose, can get 90 day with GoodRx for $24 Cut back on beer to no more than 2 per day, ideally just 1 Check with patient in November.  If he has insurance will start request to get Repatha covered on plan.  If not insured, will do paperwork to get from [employer] Safety Net Will most likely need further TG lowering, will see what labs result in 3 months.  May be able to increase fenofibrate , but will see what effect of less alcohol is.

## 2024-08-07 NOTE — Patient Instructions (Signed)
 Your Results:             Your most recent labs Goal  Total Cholesterol 260 < 200  Triglycerides 584 < 150  HDL (happy/good cholesterol) 36 > 40  LDL (lousy/bad cholesterol 120 < 70   Medication changes:  Switch to fenofibrate  54 mg - prescription at [facility]  Let me know as soon as you are able to get active insurance.  We will then start Repatha 140 mg every 14 days  Lab orders:  We want to repeat labs after 2-3 months on Repatha.  We will send you a lab order to remind you once we get closer to that time.     Thank you for choosing [facility]

## 2024-08-07 NOTE — Progress Notes (Signed)
 Office Visit    Patient Name: Larry Barber Date of Encounter: 08/07/2024  Primary Care Provider:  Clarice Nottingham, MD Primary Cardiologist:  Kardie Tobb, DO  Chief Complaint    Hyperlipidemia   Significant Past Medical History   CAD CAC = 432 (99th percentile) 25-49% stenosis in prox-mid RCA, mLAD,  palpitations Stable, knows to avoid triggers (caffeine, chocolate, EtOH)  DOE No evidence of cardiac cause, referred to pulmonology           Allergies  Allergen Reactions   Clindamycin/Lincomycin Other (See Comments)    Pitchea    Chantix [Varenicline]     Other reaction(s): made me crazy   Naproxen     Other reaction(s): nausea   Zoloft [Sertraline] Nausea And Vomiting    History of Present Illness    Larry Barber is a 53 y.o. male patient of Dr Sheena, in the office today to discuss options for cholesterol management.  Insurance Carrier: none at last visit  LDL Cholesterol goal:  LDL < 70  Current Medications:   fenofibrate  40 mg (just started at visit with Josefa Beauvais NP)  Previously tried:  rosuvastatin - memory issues; atorvastatin   Social Hx: Tobacco: no Alcohol:  drinks 6 or more beers per day several days per week    Diet:   thinks he is doing fairly well with diet.  Worked with a nutritionist earlier to help change his eating habits.  Admits could do more vegetables but good overall.     Exercise:  golf 3 days per week   6/25  HDL 36, LDL 120, TG 584, TC 260  Accessory Clinical Findings   IN [facility] 04/2024  TC 260, TG 584, HDL 69, LDL 120  Lab Results  Component Value Date   CHOL 147 11/22/2022   HDL 40 11/22/2022   LDLCALC 51 11/22/2022   TRIG 372 (H) 11/22/2022   CHOLHDL 3.7 11/22/2022    Lipoprotein (a)  Date/Time Value Ref Range Status  08/18/2021 08:07 AM 234.2 (H) <75.0 nmol/L Final    Comment:    Note:  Values greater than or equal to 75.0 nmol/L may        indicate an independent risk factor for CHD,        but must be  evaluated with caution when applied        to non-Caucasian populations due to the        influence of genetic factors on Lp(a) across        ethnicities.     Lab Results  Component Value Date   ALT 52 (H) 01/20/2023   AST 24 01/20/2023   ALKPHOS 68 01/20/2023   BILITOT 0.3 01/20/2023   Lab Results  Component Value Date   CREATININE 1.13 01/20/2023   BUN 15 01/20/2023   NA 136 01/20/2023   K 3.9 01/20/2023   CL 94 (L) 01/20/2023   CO2 24 01/20/2023   No results found for: HGBA1C  Home Medications    Current Outpatient Medications  Medication Sig Dispense Refill   Evolocumab (REPATHA SURECLICK) 140 MG/ML SOAJ Inject 140 mg into the skin every 14 (fourteen) days. 1 mL 0   fenofibrate  54 MG tablet Take 1 tablet (54 mg total) by mouth daily. 90 tablet 3   amLODipine (NORVASC) 10 MG tablet Take 10 mg by mouth daily.     cetirizine  (ZYRTEC  ALLERGY) 10 MG tablet Take 1 tablet (10 mg total) by mouth 2 (two) times daily.  EPINEPHrine 0.3 mg/0.3 mL IJ SOAJ injection Inject into the muscle.     esomeprazole (NEXIUM) 20 MG capsule Take 20 mg by mouth as needed.     famotidine  (PEPCID ) 20 MG tablet Take 1 tablet (20 mg total) by mouth 2 (two) times daily.     Melatonin 2.5-338 MG-MCG SUBL Take 1 tablet by mouth at bedtime.     nitroGLYCERIN  (NITROSTAT ) 0.4 MG SL tablet Place 1 tablet (0.4 mg total) under the tongue every 5 (five) minutes as needed for chest pain. Up to three times. 25 tablet 5   predniSONE  (DELTASONE ) 10 MG tablet Take 10 mg by mouth 2 (two) times daily with a meal. (Patient not taking: Reported on 06/27/2024)     predniSONE  (DELTASONE ) 10 MG tablet Starting 02/09/23 take 2 tablets twice a day for 1 day, then on the 2nd day take 2 tablets in the morning and on the 5th day take 1 tablet and stop (Patient not taking: Reported on 06/27/2024) 7 tablet 0   predniSONE  (DELTASONE ) 20 MG tablet Take 20 mg by mouth as needed.     No current facility-administered medications for  this visit.     Assessment & Plan    Hyperlipidemia Assessment: Patient with ASCVD not at LDL goal of < 70 Most recent LDL 120 in June 2025 Triglycerides elevated at 584, currently on fenofibrate  40 mg, but paying ~$100/month Uninsured, is looking to start [employer] on Dec 1. Not able to tolerate statins secondary to brain fog/memory concerns Reviewed options for lowering LDL cholesterol, including PCSK-9 inhibitors and inclisiran.  Discussed mechanisms of action, dosing, side effects, potential decreases in LDL cholesterol and costs.  Also reviewed potential options for patient assistance. Patient previously tried low dose statin twice weekly and was unable to tolerate.    Plan: Switched fenofibrate  to 54 mg dose, can get 90 day with GoodRx for $24 Cut back on beer to no more than 2 per day, ideally just 1 Check with patient in November.  If he has insurance will start request to get Repatha covered on plan.  If not insured, will do paperwork to get from [employer] Safety Net Will most likely need further TG lowering, will see what labs result in 3 months.  May be able to increase fenofibrate , but will see what effect of less alcohol is.     Allean Mink, PharmD CPP [facility] [address]   [place], [place] [address] [phone]  08/07/2024, 1:40 PM

## 2024-08-08 ENCOUNTER — Ambulatory Visit: Payer: Self-pay

## 2024-08-08 VITALS — BP 116/74 | HR 93 | Temp 98.0°F | Ht 74.0 in | Wt 222.0 lb

## 2024-08-08 DIAGNOSIS — Z87891 Personal history of nicotine dependence: Secondary | ICD-10-CM

## 2024-08-08 DIAGNOSIS — R0602 Shortness of breath: Secondary | ICD-10-CM

## 2024-08-08 NOTE — Progress Notes (Signed)
 Subjective:   PATIENT ID: Larry Barber GENDER: male DOB: Feb 01, 1971, MRN: [id number]   HPI 53 year old male with a past medical history of hyperlipidemia, coronary artery disease who is presenting to the pulmonary clinic for referral for dyspnea on exertion.  He states that for the past 2 years he he had dyspnea on exertion which has worsened this year.  He was evaluated by cardiology and has some coronary artery disease but not really obstructive and would just need monitoring at this time.  He is a former smoker quit in 2014 but he continues to smoke cigar when he goes to the beach.  He smoked a pack a day on average for about 20 years so he has a 20-pack-year smoking history.  He has never had issues with breathing that led to him being hospitalized or seen at urgent care.  He has no history of childhood asthma.  He has no fevers or chills or night sweats.  He has no loss of appetite or unintentional weight loss.  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  He did say that in the past he did not wear a mask consistently when he did the stone cutting.  He has no evidence of silicosis on his CT scan though.  He denies any cough symptoms.  He denies any sputum production.  He does have some acid reflux for which he takes Pepcid .  He also has some swelling for which he sees allergy.  He is on Zyrtec , Pepcid , he has an EpiPen for that issue.  He was also prescribed prednisone  in the past but not for breathing issues before the swelling.  A he does not have family history of lung cancer.  He states that his grandfather had esophageal cancer.  Past Medical History:  Diagnosis Date   Angio-edema    GERD (gastroesophageal reflux disease)      Family History  Problem Relation Age of  Onset   Other Mother        Accident   Heart disease Father    Heart disease Paternal Grandfather    Allergies Half-Brother      Social History   Socioeconomic History   Marital status: Single    Spouse name: Not on file   Number of children: Not on file   Years of education: Not on file   Highest education level: Not on file  Occupational History   Not on file  Tobacco Use   Smoking status: Former    Types: Cigarettes   Smokeless tobacco: Current    Types: Chew  Substance and Sexual Activity   Alcohol use: Never   Drug use: Never   Sexual activity: Not on file  Other Topics Concern   Not on file  Social History Narrative   Not on file   Social Drivers of Health   Financial Resource Strain: Low Risk  (08/13/2022)   Overall Financial Resource Strain (CARDIA)    Difficulty of Paying Living Expenses: Not very hard  Food Insecurity: No Food Insecurity (08/13/2022)   Hunger Vital Sign    Worried About Running Out of Food in the Last Year: Never true    Ran Out of Food in the Last Year: Never true  Transportation Needs: No Transportation Needs (  08/13/2022)   PRAPARE - Administrator, Civil Service (Medical): No    Lack of Transportation (Non-Medical): No  Physical Activity: Not on file  Stress: Not on file  Social Connections: Unknown (03/11/2022)   Received from [facility]   Social Network    Social Network: Not on file  Intimate Partner Violence: Unknown (02/10/2022)   Received from [facility]   HITS    Physically Hurt: Not on file    Insult or Talk Down To: Not on file    Threaten Physical Harm: Not on file    Scream or Curse: Not on file     Allergies  Allergen Reactions   Clindamycin/Lincomycin Other (See Comments)    Pitchea    Chantix [Varenicline]     Other reaction(s): made me crazy   Naproxen     Other reaction(s): nausea   Zoloft [Sertraline] Nausea And Vomiting     Outpatient Medications Prior to Visit  Medication Sig Dispense  Refill   amLODipine (NORVASC) 10 MG tablet Take 10 mg by mouth daily.     cetirizine  (ZYRTEC  ALLERGY) 10 MG tablet Take 1 tablet (10 mg total) by mouth 2 (two) times daily.     EPINEPHrine 0.3 mg/0.3 mL IJ SOAJ injection Inject into the muscle.     esomeprazole (NEXIUM) 20 MG capsule Take 20 mg by mouth as needed.     Evolocumab (REPATHA SURECLICK) 140 MG/ML SOAJ Inject 140 mg into the skin every 14 (fourteen) days. 1 mL 0   famotidine  (PEPCID ) 20 MG tablet Take 1 tablet (20 mg total) by mouth 2 (two) times daily.     fenofibrate  54 MG tablet Take 1 tablet (54 mg total) by mouth daily. 90 tablet 3   Melatonin 2.5-338 MG-MCG SUBL Take 1 tablet by mouth at bedtime.     nitroGLYCERIN  (NITROSTAT ) 0.4 MG SL tablet Place 1 tablet (0.4 mg total) under the tongue every 5 (five) minutes as needed for chest pain. Up to three times. 25 tablet 5   predniSONE  (DELTASONE ) 10 MG tablet Take 10 mg by mouth 2 (two) times daily with a meal. (Patient not taking: Reported on 06/27/2024)     predniSONE  (DELTASONE ) 10 MG tablet Starting 02/09/23 take 2 tablets twice a day for 1 day, then on the 2nd day take 2 tablets in the morning and on the 5th day take 1 tablet and stop (Patient not taking: Reported on 06/27/2024) 7 tablet 0   predniSONE  (DELTASONE ) 20 MG tablet Take 20 mg by mouth as needed.     No facility-administered medications prior to visit.    ROS Reviewed all systems and reported negative except as above     Objective:  There were no vitals filed for this visit.      CBC    Component Value Date/Time   WBC 9.1 01/20/2023 1421   RBC 5.33 01/20/2023 1421   HGB 14.8 01/20/2023 1421   HCT 45.4 01/20/2023 1421   PLT 306 01/20/2023 1421   MCV 85 01/20/2023 1421   MCH 27.8 01/20/2023 1421   MCHC 32.6 01/20/2023 1421   RDW 12.8 01/20/2023 1421   LYMPHSABS 2.7 01/20/2023 1421   EOSABS 0.3 01/20/2023 1421   BASOSABS 0.0 01/20/2023 1421     Chest imaging: I reviewed his CT chest.  There is no  evidence of silicosis.  He has some evidence of mild upper lobe predominant emphysema but otherwise no significant findings.  PFT: He has no PFTs on  file        Assessment & Plan:   Assessment & Plan Shortness of breath He has shortness of breath on exertion.  It is unclear if he has COPD at this time.  Plan to obtain PFTs.  Will consider inhalers in the future if his PFTs show evidence of obstruction. Orders:   Pulmonary Function Test; Future  Former smoker He is a former smoker with a 20-pack-year smoking history.  Quit in 2014.  He does not qualify for lung cancer screening.  However he did get a coronary CT calcium  scoring which does not show any concerning nodules      Zola Herter, MD [facility] Office: [phone]

## 2024-08-08 NOTE — Patient Instructions (Signed)
 It was nice meeting you today  We will get pulmonary function tests to evaluate your lungs   We will see you back in the clinic after the PFTs are done

## 2024-08-14 NOTE — Telephone Encounter (Signed)
 LVM regarding Echo results sent via MyChart. Pt was asked to call our office if he has any further questions or concerns.

## 2024-09-25 ENCOUNTER — Encounter: Payer: Self-pay | Admitting: *Deleted

## 2024-09-26 ENCOUNTER — Ambulatory Visit: Payer: Self-pay | Attending: Cardiology | Admitting: Cardiology

## 2024-09-26 ENCOUNTER — Encounter: Payer: Self-pay | Admitting: Cardiology

## 2024-09-26 VITALS — BP 129/75 | HR 90 | Ht 74.0 in | Wt 224.2 lb

## 2024-09-26 DIAGNOSIS — I2510 Atherosclerotic heart disease of native coronary artery without angina pectoris: Secondary | ICD-10-CM

## 2024-09-26 DIAGNOSIS — R400 Somnolence: Secondary | ICD-10-CM

## 2024-09-26 DIAGNOSIS — R5383 Other fatigue: Secondary | ICD-10-CM

## 2024-09-26 DIAGNOSIS — R0683 Snoring: Secondary | ICD-10-CM

## 2024-09-26 DIAGNOSIS — E782 Mixed hyperlipidemia: Secondary | ICD-10-CM

## 2024-09-26 NOTE — Progress Notes (Signed)
 Cardiology Office Note:    Date:  09/29/2024   ID:  Larry Barber, DOB February 18, 1971, MRN [id number]  PCP:  Clarice Nottingham, MD  Cardiologist:  Dub Huntsman, DO  Electrophysiologist:  None   Referring MD: Clarice Nottingham, MD    I am still short of breath  History of Present Illness:    Larry Barber is a 53 y.o. male with a hx of hyperlipidemia, coronary artery disease by CT scan.  Since his last visit with me he has followed in our office with Josefa Beauvais, NP and Allean Mink, PharmD.  During that time he was started on fenofibrate  for hypertriglyceridemia.  He also has has persistent shortness of breath recent echo was not impressive.  He was referred to pulmonary and he is pending a pulmonary function test.  Today he tells me that the shortness of breath continues to progress while he wakeful his pulmonary function tests he is a former smoker so we need to rule out obstructive lung disease as well.  He shares with me he is experiencing daytime somnolence and some fatigue and he also does snore.  No chest pain   Past Medical History:  Diagnosis Date   Angio-edema    GERD (gastroesophageal reflux disease)     Past Surgical History:  Procedure Laterality Date   NO PAST SURGERIES      Current Medications: Current Meds  Medication Sig   amLODipine (NORVASC) 10 MG tablet Take 10 mg by mouth daily.   cetirizine  (ZYRTEC  ALLERGY) 10 MG tablet Take 1 tablet (10 mg total) by mouth 2 (two) times daily.   EPINEPHrine 0.3 mg/0.3 mL IJ SOAJ injection Inject into the muscle.   esomeprazole (NEXIUM) 20 MG capsule Take 20 mg by mouth as needed.   famotidine  (PEPCID ) 20 MG tablet Take 1 tablet (20 mg total) by mouth 2 (two) times daily.   fenofibrate  54 MG tablet Take 1 tablet (54 mg total) by mouth daily.   Melatonin 2.5-338 MG-MCG SUBL Take 1 tablet by mouth at bedtime.   nitroGLYCERIN  (NITROSTAT ) 0.4 MG SL tablet Place 1 tablet (0.4 mg total) under the tongue every 5 (five) minutes  as needed for chest pain. Up to three times.   predniSONE  (DELTASONE ) 10 MG tablet Take 10 mg by mouth 2 (two) times daily with a meal.     Allergies:   Clindamycin/lincomycin, Chantix [varenicline], Naproxen, and Zoloft [sertraline]   Social History   Socioeconomic History   Marital status: Single    Spouse name: Not on file   Number of children: Not on file   Years of education: Not on file   Highest education level: Not on file  Occupational History   Not on file  Tobacco Use   Smoking status: Former    Types: Cigarettes    Passive exposure: Past   Smokeless tobacco: Current    Types: Chew   Tobacco comments:    Quit smoking in 2014.  Smoked 20 years or more.    At the most smoked 1 ppd.        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  Substance and Sexual Activity   Alcohol use: Never   Drug use: Never   Sexual activity: Not on file  Other Topics Concern   Not on file  Social History Narrative   Not on file   Social Drivers of Health   Financial Resource Strain: Low Risk  (08/13/2022)   Overall Financial Resource Strain (CARDIA)    Difficulty of Paying Living Expenses:  Not very hard  Food Insecurity: No Food Insecurity (08/13/2022)   Hunger Vital Sign    Worried About Running Out of Food in the Last Year: Never true    Ran Out of Food in the Last Year: Never true  Transportation Needs: No Transportation Needs (08/13/2022)   PRAPARE - Administrator, Civil Service (Medical): No    Lack of Transportation (Non-Medical): No  Physical Activity: Not on file  Stress: Not on file  Social Connections: Unknown (03/11/2022)   Received from [facility]   Social Network    Social Network: Not on file     Family History: The patient's family history includes Allergies in his half-brother; Heart disease in his father and paternal grandfather; Other in his mother.  ROS:   Review of Systems  Constitution: Negative for decreased appetite, fever and weight gain.  HENT: Negative for  congestion, ear discharge, hoarse voice and sore throat.   Eyes: Negative for discharge, redness, vision loss in right eye and visual halos.  Cardiovascular: Negative for chest pain, dyspnea on exertion, leg swelling, orthopnea and palpitations.  Respiratory: Negative for cough, hemoptysis, shortness of breath and snoring.   Endocrine: Negative for heat intolerance and polyphagia.  Hematologic/Lymphatic: Negative for bleeding problem. Does not bruise/bleed easily.  Skin: Negative for flushing, nail changes, rash and suspicious lesions.  Musculoskeletal: Negative for arthritis, joint pain, muscle cramps, myalgias, neck pain and stiffness.  Gastrointestinal: Negative for abdominal pain, bowel incontinence, diarrhea and excessive appetite.  Genitourinary: Negative for decreased libido, genital sores and incomplete emptying.  Neurological: Negative for brief paralysis, focal weakness, headaches and loss of balance.  Psychiatric/Behavioral: Negative for altered mental status, depression and suicidal ideas.  Allergic/Immunologic: Negative for HIV exposure and persistent infections.    EKGs/Labs/Other Studies Reviewed:    The following studies were reviewed today:   EKG:  The ekg ordered today demonstrates   Recent Labs: No results found for requested labs within last 365 days.  Recent Lipid Panel    Component Value Date/Time   CHOL 217 (H) 09/26/2024 1113   TRIG 597 (HH) 09/26/2024 1113   HDL 39 (L) 09/26/2024 1113   CHOLHDL 5.6 (H) 09/26/2024 1113   LDLCALC 82 09/26/2024 1113    Physical Exam:    VS:  BP 129/75 (BP Location: Left Arm, Patient Position: Sitting, Cuff Size: Normal)   Pulse 90   Ht 6' 2 (1.88 m)   Wt 224 lb 3.2 oz (101.7 kg)   SpO2 97%   BMI 28.79 kg/m     Wt Readings from Last 3 Encounters:  09/26/24 224 lb 3.2 oz (101.7 kg)  08/08/24 222 lb (100.7 kg)  06/27/24 221 lb 12.8 oz (100.6 kg)     GEN: Well nourished, well developed in no acute distress HEENT:  Normal NECK: No JVD; No carotid bruits LYMPHATICS: No lymphadenopathy CARDIAC: S1S2 noted,RRR, no murmurs, rubs, gallops RESPIRATORY:  Clear to auscultation without rales, wheezing or rhonchi  ABDOMEN: Soft, non-tender, non-distended, +bowel sounds, no guarding. EXTREMITIES: No edema, No cyanosis, no clubbing MUSCULOSKELETAL:  No deformity  SKIN: Warm and dry NEUROLOGIC:  Alert and oriented x 3, non-focal PSYCHIATRIC:  Normal affect, good insight  ASSESSMENT:    1. Snoring   2. Daytime somnolence   3. Fatigue, unspecified type   4. Mixed hyperlipidemia   5. Mild CAD    PLAN:    Dyspnea on exertion-I do agree with getting a pulmonary function test given the fact that he is  a former smoker.  In the meantime I am going to order an Itamar sleep study  Hypertriglyceridemia-he was started on fenofibrate  54 mg daily.  He also is on Repatha .  Triglycerides in June 2025 was 584.  The lipids 120.  Hoping that his new regimen has helped we will repeat lipid profile today.  Mild coronary artery disease-no angina symptoms  The patient is in agreement with the above plan. The patient left the office in stable condition.  The patient will follow up in   Medication Adjustments/Labs and Tests Ordered: Current medicines are reviewed at length with the patient today.  Concerns regarding medicines are outlined above.  Orders Placed This Encounter  Procedures   Lipid panel   Itamar Sleep Study   No orders of the defined types were placed in this encounter.   Patient Instructions  Medication Instructions:  Your physician recommends that you continue on your current medications as directed. Please refer to the Current Medication list given to you today.  *If you need a refill on your cardiac medications before your next appointment, please call your pharmacy*  Lab Work: Lipids If you have labs (blood work) drawn today and your tests are completely normal, you will receive your results only  by: MyChart Message (if you have MyChart) OR A paper copy in the mail If you have any lab test that is abnormal or we need to change your treatment, we will call you to review the results.  Testing/Procedures: WatchPAT? is an FDA-cleared portable home sleep study test that uses a watch and 3 points of contact to monitor 7 different channels, including your heart rate, oxygen saturation, body position, snoring, and chest motion.  The study is easy to use from the comfort of your own home and accurately detect sleep apnea.  Before bed, you attach the chest sensor, attached the sleep apnea bracelet to your nondominant hand, and attach the finger probe.  After the study, the raw data is downloaded from the watch and scored for apnea events.   For more information: [email or web address]  Patient Testing Instructions:  Once our office has received insurance approval for you to complete this test, we will contact you with a PIN to activate the device.  This typically takes 2-3 weeks.  Please do not open the box until approved.   Do not put battery into the device until bedtime when you are ready to begin the test. Please call the support number if you need assistance after following the instructions below: 24 hour support line- [phone] or ITAMAR support at [phone] (option 2)  Download the Itamar WatchPAT One app through the [employer] or [employer]. Be sure to turn on or enable access to Bluetooth in settings on your smartphone. Make sure no other Bluetooth devices are on and within the vicinity of your smartphone and WatchPAT watch during testing.  Make sure to leave your smart phone plugged in and charging all night.  When ready for bed:  Follow the instructions step by step in the WatchPAT One app to activate the testing device. For additional instructions, including video instruction, visit the WatchPAT One video on Youtube. You can search for WatchPAT One  within Youtube (video is 4 minutes and 18 seconds) or enter: https://youtube/[email or web address]?[email or web address] Please note: You will be prompted to enter a PIN to connect via Bluetooth when starting the test. The PIN will be assigned to you after insurance has approved the test.  The device is disposable, but it  recommended that you retain the device until you receive a call letting you know the study has been received and the results have been interpreted.  We will let you know if the study did not transmit to us  properly after the test is completed. You do not need to call us  to confirm the receipt of the test.  Please complete the test within 48 hours of receiving PIN.   Frequently Asked Questions:  What is Watch PAT One?  A single use, fully disposable home sleep apnea testing device and will not need to be returned after completion.  What are the requirements to use WatchPAT One?  A successful WatchPAT One sleep study requires a WatchPAT One device, your smart phone, WatchPAT One app, your PIN number, and internet access. What type of phone do I need?  You should have a smart phone that uses Android 5.1 and above or any iPhone with IOS 10 and above. How can I download the WatchPAT one app?  Based on your device type search for WatchPAT One app either in [employer] for [employer] or [employer] for [employer]. Where will I get my PIN for the study?  Your PIN will be provided by your physician's office after insurance has approved the test. This process typically takes 2-3 weeks. It is used for authentication and if you lose/forget your PIN, please reach out to your provider's office.  I do not have internet at home. Can I still complete a WatchPAT One study?  WatchPAT One needs internet connection throughout the night to be able to transmit the sleep data. You can use your home/local internet or your cellular data package. However, it is always recommended to use home/local internet. It is  estimated that between 20MB-30MB of data will be used with each study, but the application will be looking for space in the phone to start the study.  What happens if I lose internet or Bluetooth connection?  During the internet disconnection, your phone will not be able to transmit the sleep data.  All the data, will be stored in your phone.  As soon as the internet connection is back on, the phone will resume sending the sleep data. During the Bluetooth disconnection, WatchPAT One will not be able to to send the sleep data to your phone.  Data will be kept in the WatchPAT One until both devices have Bluetooth connection back on.  As soon as the connection is back on, WatchPAT one will send the sleep data to the phone.  How long do I need to wear the WatchPAT one?  After you start the study, you should wear the device at least 6 hours.  How far should I keep my phone from the device?  During the night, your phone should remain within 15 feet of where you sleep.  What happens if I leave the room for restroom or other reasons?  Leaving the room for any reason will not cause any problem. As soon as your get back to the room, both devices will reconnect and will continue to send the sleep data. Can I use my phone during the sleep study?  Yes, you can use your phone as usual during the study. But it is recommended to put your WatchPAT One on when you are ready to go to bed.  How will I get my study results?  A soon as you completed your study, your sleep data will be sent to the provider. They  will then share the results with you when they are ready.     Follow-Up: At [facility], you and your health needs are our priority.  As part of our continuing mission to provide you with exceptional heart care, our providers are all part of one team.  This team includes your primary Cardiologist (physician) and Advanced Practice Providers or APPs (Physician Assistants and Nurse Practitioners) who  all work together to provide you with the care you need, when you need it.  Your next appointment:   6 month(s)  Provider:   Maeson Purohit, DO       Adopting a Healthy Lifestyle.  Know what a healthy weight is for you (roughly BMI <25) and aim to maintain this   Aim for 7+ servings of fruits and vegetables daily   65-80+ fluid ounces of water or unsweet tea for healthy kidneys   Limit to max 1 drink of alcohol per day; avoid smoking/tobacco   Limit animal fats in diet for cholesterol and heart health - choose grass fed whenever available   Avoid highly processed foods, and foods high in saturated/trans fats   Aim for low stress - take time to unwind and care for your mental health   Aim for 150 min of moderate intensity exercise weekly for heart health, and weights twice weekly for bone health   Aim for 7-9 hours of sleep daily   When it comes to diets, agreement about the perfect plan isnt easy to find, even among the experts. Experts at the [facility] [employer] developed an idea known as the Healthy Eating Plate. Just imagine a plate divided into logical, healthy portions.   The emphasis is on diet quality:   Load up on vegetables and fruits - one-half of your plate: Aim for color and variety, and remember that potatoes dont count.   Go for whole grains - one-quarter of your plate: Whole wheat, barley, wheat berries, quinoa, oats, brown rice, and foods made with them. If you want pasta, go with whole wheat pasta.   Protein power - one-quarter of your plate: Fish, chicken, beans, and nuts are all healthy, versatile protein sources. Limit red meat.   The diet, however, does go beyond the plate, offering a few other suggestions.   Use healthy plant oils, such as olive, canola, soy, corn, sunflower and peanut. Check the labels, and avoid partially hydrogenated oil, which have unhealthy trans fats.   If youre thirsty, drink water. Coffee and tea are good in  moderation, but skip sugary drinks and limit milk and dairy products to one or two daily servings.   The type of carbohydrate in the diet is more important than the amount. Some sources of carbohydrates, such as vegetables, fruits, whole grains, and beans-are healthier than others.   Finally, stay active  Signed, Dub Huntsman, DO  09/29/2024 7:54 AM    [facility]

## 2024-09-26 NOTE — Patient Instructions (Signed)
 Medication Instructions:  Your physician recommends that you continue on your current medications as directed. Please refer to the Current Medication list given to you today.  *If you need a refill on your cardiac medications before your next appointment, please call your pharmacy*  Lab Work: Lipids If you have labs (blood work) drawn today and your tests are completely normal, you will receive your results only by: MyChart Message (if you have MyChart) OR A paper copy in the mail If you have any lab test that is abnormal or we need to change your treatment, we will call you to review the results.  Testing/Procedures: WatchPAT? is an FDA-cleared portable home sleep study test that uses a watch and 3 points of contact to monitor 7 different channels, including your heart rate, oxygen saturation, body position, snoring, and chest motion.  The study is easy to use from the comfort of your own home and accurately detect sleep apnea.  Before bed, you attach the chest sensor, attached the sleep apnea bracelet to your nondominant hand, and attach the finger probe.  After the study, the raw data is downloaded from the watch and scored for apnea events.   For more information: [email or web address]  Patient Testing Instructions:  Once our office has received insurance approval for you to complete this test, we will contact you with a PIN to activate the device.  This typically takes 2-3 weeks.  Please do not open the box until approved.   Do not put battery into the device until bedtime when you are ready to begin the test. Please call the support number if you need assistance after following the instructions below: 24 hour support line- [phone] or [facility] support at [phone] (option 2)  Download the [facility] WatchPAT One app through the [employer] or [employer]. Be sure to turn on or enable access to Bluetooth in settings on your smartphone. Make sure no other  Bluetooth devices are on and within the vicinity of your smartphone and WatchPAT watch during testing.  Make sure to leave your smart phone plugged in and charging all night.  When ready for bed:  Follow the instructions step by step in the WatchPAT One app to activate the testing device. For additional instructions, including video instruction, visit the WatchPAT One video on Youtube. You can search for WatchPAT One within Youtube (video is 4 minutes and 18 seconds) or enter: https://youtube/[email or web address]?[email or web address] Please note: You will be prompted to enter a PIN to connect via Bluetooth when starting the test. The PIN will be assigned to you after insurance has approved the test.  The device is disposable, but it recommended that you retain the device until you receive a call letting you know the study has been received and the results have been interpreted.  We will let you know if the study did not transmit to us  properly after the test is completed. You do not need to call us  to confirm the receipt of the test.  Please complete the test within 48 hours of receiving PIN.   Frequently Asked Questions:  What is Watch PAT One?  A single use, fully disposable home sleep apnea testing device and will not need to be returned after completion.  What are the requirements to use WatchPAT One?  A successful WatchPAT One sleep study requires a WatchPAT One device, your smart phone, WatchPAT One app, your PIN number, and internet access. What type of phone do I need?  You should have a smart  phone that uses Android 5.1 and above or any iPhone with IOS 10 and above. How can I download the WatchPAT one app?  Based on your device type search for WatchPAT One app either in [employer] for [employer] or [employer] for [employer]. Where will I get my PIN for the study?  Your PIN will be provided by your physician's office after insurance has approved the test. This process typically takes 2-3 weeks. It  is used for authentication and if you lose/forget your PIN, please reach out to your provider's office.  I do not have internet at home. Can I still complete a WatchPAT One study?  WatchPAT One needs internet connection throughout the night to be able to transmit the sleep data. You can use your home/local internet or your cellular data package. However, it is always recommended to use home/local internet. It is estimated that between 20MB-30MB of data will be used with each study, but the application will be looking for space in the phone to start the study.  What happens if I lose internet or Bluetooth connection?  During the internet disconnection, your phone will not be able to transmit the sleep data.  All the data, will be stored in your phone.  As soon as the internet connection is back on, the phone will resume sending the sleep data. During the Bluetooth disconnection, WatchPAT One will not be able to to send the sleep data to your phone.  Data will be kept in the WatchPAT One until both devices have Bluetooth connection back on.  As soon as the connection is back on, WatchPAT one will send the sleep data to the phone.  How long do I need to wear the WatchPAT one?  After you start the study, you should wear the device at least 6 hours.  How far should I keep my phone from the device?  During the night, your phone should remain within 15 feet of where you sleep.  What happens if I leave the room for restroom or other reasons?  Leaving the room for any reason will not cause any problem. As soon as your get back to the room, both devices will reconnect and will continue to send the sleep data. Can I use my phone during the sleep study?  Yes, you can use your phone as usual during the study. But it is recommended to put your WatchPAT One on when you are ready to go to bed.  How will I get my study results?  A soon as you completed your study, your sleep data will be sent to the provider.  They will then share the results with you when they are ready.     Follow-Up: At [facility], you and your health needs are our priority.  As part of our continuing mission to provide you with exceptional heart care, our providers are all part of one team.  This team includes your primary Cardiologist (physician) and Advanced Practice Providers or APPs (Physician Assistants and Nurse Practitioners) who all work together to provide you with the care you need, when you need it.  Your next appointment:   6 month(s)  Provider:   Kardie Tobb, DO

## 2024-09-27 LAB — LIPID PANEL
Chol/HDL Ratio: 5.6 ratio — ABNORMAL HIGH (ref 0.0–5.0)
Cholesterol, Total: 217 mg/dL — ABNORMAL HIGH (ref 100–199)
HDL: 39 mg/dL — ABNORMAL LOW (ref >39)
LDL Chol Calc (NIH): 82 mg/dL (ref 0–99)
Triglycerides: 597 mg/dL (ref 0–149)
VLDL Cholesterol Cal: 96 mg/dL — ABNORMAL HIGH (ref 5–40)

## 2024-10-08 ENCOUNTER — Ambulatory Visit: Payer: Self-pay | Admitting: Cardiology

## 2024-10-08 DIAGNOSIS — Z79899 Other long term (current) drug therapy: Secondary | ICD-10-CM

## 2024-10-08 DIAGNOSIS — E782 Mixed hyperlipidemia: Secondary | ICD-10-CM

## 2024-10-09 MED ORDER — ICOSAPENT ETHYL 0.5 G PO CAPS: 1.0000 | ORAL_CAPSULE | Freq: Two times a day (BID) | ORAL | 3 refills | Status: AC

## 2024-10-09 NOTE — Telephone Encounter (Signed)
 Per Dr. Sheena, ordered  Vasepa 500 mg twice a day for the triglycerides, repeat lipid panel in 12 weeks. MC to patient.

## 2024-10-11 ENCOUNTER — Ambulatory Visit (INDEPENDENT_AMBULATORY_CARE_PROVIDER_SITE_OTHER): Payer: Self-pay | Admitting: *Deleted

## 2024-10-11 ENCOUNTER — Ambulatory Visit: Payer: Self-pay

## 2024-10-11 VITALS — BP 130/85 | HR 99 | Ht 74.0 in | Wt 223.0 lb

## 2024-10-11 DIAGNOSIS — Z87891 Personal history of nicotine dependence: Secondary | ICD-10-CM

## 2024-10-11 DIAGNOSIS — R0609 Other forms of dyspnea: Secondary | ICD-10-CM

## 2024-10-11 DIAGNOSIS — R0602 Shortness of breath: Secondary | ICD-10-CM

## 2024-10-11 LAB — PULMONARY FUNCTION TEST
DL/VA % pred: 101 %
DL/VA: 4.39 ml/min/mmHg/L
DLCO cor % pred: 104 %
DLCO cor: 34.31 ml/min/mmHg
DLCO unc % pred: 104 %
DLCO unc: 34.31 ml/min/mmHg
FEF 25-75 Post: 3.76 L/s
FEF 25-75 Pre: 3.3 L/s
FEF2575-%Change-Post: 14 %
FEF2575-%Pred-Post: 98 %
FEF2575-%Pred-Pre: 86 %
FEV1-%Change-Post: 5 %
FEV1-%Pred-Post: 91 %
FEV1-%Pred-Pre: 87 %
FEV1-Post: 4.06 L
FEV1-Pre: 3.86 L
FEV1FVC-%Change-Post: 3 %
FEV1FVC-%Pred-Pre: 97 %
FEV6-%Change-Post: 1 %
FEV6-%Pred-Post: 93 %
FEV6-%Pred-Pre: 91 %
FEV6-Post: 5.17 L
FEV6-Pre: 5.08 L
FEV6FVC-%Change-Post: 0 %
FEV6FVC-%Pred-Post: 103 %
FEV6FVC-%Pred-Pre: 102 %
FVC-%Change-Post: 1 %
FVC-%Pred-Post: 90 %
FVC-%Pred-Pre: 89 %
FVC-Post: 5.21 L
FVC-Pre: 5.12 L
Post FEV1/FVC ratio: 78 %
Post FEV6/FVC ratio: 100 %
Pre FEV1/FVC ratio: 75 %
Pre FEV6/FVC Ratio: 99 %
RV % pred: 117 %
RV: 2.67 L
TLC % pred: 102 %
TLC: 7.94 L

## 2024-10-11 NOTE — Progress Notes (Signed)
 Subjective:   PATIENT ID: Larry Barber GENDER: male DOB: 1971/04/25, MRN: [id number]   HPI Discussed the use of AI scribe software for clinical note transcription with the patient, who gave verbal consent to proceed.  History of Present Illness Larry Barber is a 53 year old male who presents with exertional dyspnea.  He experiences difficulty breathing during physical exertion, such as playing golf or doing yard work. He describes the sensation as 'tough to catch my breath'. No chest pressure, wheezing, or coughing. He feels that his breathing is not as effective as it should be, although he does not experience tightness in his breathing.  He has a significant smoking history but quit smoking eleven years ago.  He was recently diagnosed with a genetic mutation, MTHFR, and has experienced some unusual allergic reactions. 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  He has a history of high triglycerides, with recent levels reported at 600 mg/dL, and is currently on fenofibrate . He has been prescribed an additional medication to manage his triglyceride levels. His LDL cholesterol levels are not elevated.  He underwent an echocardiogram in September and a CT scan of the chest in 2022.     Past Medical History:  Diagnosis Date   Angio-edema    GERD (gastroesophageal reflux disease)      Family History  Problem Relation Age of Onset   Other Mother        Accident   Heart disease Father    Heart disease Paternal Grandfather    Allergies Half-Brother      Social History   Socioeconomic History   Marital status: Single    Spouse name: Not on file   Number of children: Not on file   Years of education: Not on file   Highest education level: Not on file  Occupational History   Not on file  Tobacco Use   Smoking status: Former    Types: Cigarettes    Passive exposure: Past   Smokeless tobacco: Current    Types: Chew    Tobacco comments:    Quit smoking in 2014.  Smoked 20 years or more.    At the most smoked 1 ppd.        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  Substance and Sexual Activity   Alcohol use: Never   Drug use: Never   Sexual activity: Not on file  Other Topics Concern   Not on file  Social History Narrative   Not on file   Social Drivers of Health   Financial Resource Strain: Low Risk  (08/13/2022)   Overall Financial Resource Strain (CARDIA)    Difficulty of Paying Living Expenses: Not very hard  Food Insecurity: No Food Insecurity (08/13/2022)   Hunger Vital Sign    Worried About Running Out of Food in the Last Year: Never true    Ran Out of Food in the Last Year: Never true  Transportation Needs: No Transportation Needs (08/13/2022)   PRAPARE - Administrator, Civil Service (Medical): No    Lack of Transportation (Non-Medical): No  Physical Activity: Not on file  Stress: Not on file  Social Connections: Unknown (03/11/2022)   Received from [facility]   Social Network    Social Network: Not on file  Intimate Partner Violence: Unknown (02/10/2022)   Received from [facility]   HITS    Physically Hurt: Not on file    Insult or Talk Down To: Not on  file    Threaten Physical Harm: Not on file    Scream or Curse: Not on file     Allergies  Allergen Reactions   Clindamycin/Lincomycin Other (See Comments)    Pitchea    Chantix [Varenicline]     Other reaction(s): made me crazy   Naproxen     Other reaction(s): nausea   Zoloft [Sertraline] Nausea And Vomiting     Outpatient Medications Prior to Visit  Medication Sig Dispense Refill   amLODipine (NORVASC) 10 MG tablet Take 10 mg by mouth daily.     cetirizine  (ZYRTEC  ALLERGY) 10 MG tablet Take 1 tablet (10 mg total) by mouth 2 (two) times daily.     EPINEPHrine 0.3 mg/0.3 mL IJ SOAJ injection Inject into the muscle.     esomeprazole (NEXIUM) 20 MG capsule Take 20 mg by mouth as needed.     Evolocumab  (REPATHA  SURECLICK) 140  MG/ML SOAJ Inject 140 mg into the skin every 14 (fourteen) days. 1 mL 0   famotidine  (PEPCID ) 20 MG tablet Take 1 tablet (20 mg total) by mouth 2 (two) times daily.     fenofibrate  54 MG tablet Take 1 tablet (54 mg total) by mouth daily. 90 tablet 3   Icosapent Ethyl (VASCEPA) 0.5 g CAPS Take 1 capsule (0.5 g total) by mouth 2 (two) times daily. 60 capsule 3   Melatonin 2.5-338 MG-MCG SUBL Take 1 tablet by mouth at bedtime.     nitroGLYCERIN  (NITROSTAT ) 0.4 MG SL tablet Place 1 tablet (0.4 mg total) under the tongue every 5 (five) minutes as needed for chest pain. Up to three times. 25 tablet 5   predniSONE  (DELTASONE ) 10 MG tablet Take 10 mg by mouth 2 (two) times daily with a meal.     predniSONE  (DELTASONE ) 10 MG tablet Starting 02/09/23 take 2 tablets twice a day for 1 day, then on the 2nd day take 2 tablets in the morning and on the 5th day take 1 tablet and stop 7 tablet 0   predniSONE  (DELTASONE ) 20 MG tablet Take 20 mg by mouth as needed.     No facility-administered medications prior to visit.    ROS Reviewed all systems and reported negative except as above     Objective:   Vitals:   10/11/24 1532  BP: 130/85  Pulse: 99  SpO2: 96%  Weight: 223 lb (101.2 kg)  Height: 6' 2 (1.88 m)        CBC    Component Value Date/Time   WBC 9.1 01/20/2023 1421   RBC 5.33 01/20/2023 1421   HGB 14.8 01/20/2023 1421   HCT 45.4 01/20/2023 1421   PLT 306 01/20/2023 1421   MCV 85 01/20/2023 1421   MCH 27.8 01/20/2023 1421   MCHC 32.6 01/20/2023 1421   RDW 12.8 01/20/2023 1421   LYMPHSABS 2.7 01/20/2023 1421   EOSABS 0.3 01/20/2023 1421   BASOSABS 0.0 01/20/2023 1421     Chest imaging: I reviewed his CT chest coronary performed in 2023  PFT:    Latest Ref Rng & Units 10/11/2024    2:04 PM  PFT Results  FVC-Pre L 5.12   FVC-Predicted Pre % 89   FVC-Post L 5.21   FVC-Predicted Post % 90   Pre FEV1/FVC % % 75   Post FEV1/FCV % % 78   FEV1-Pre L 3.86   FEV1-Predicted  Pre % 87   FEV1-Post L 4.06   DLCO uncorrected ml/min/mmHg 34.31   DLCO UNC% % 104  DLCO corrected ml/min/mmHg 34.31   DLCO COR %Predicted % 104   DLVA Predicted % 101   TLC L 7.94   TLC % Predicted % 102   RV % Predicted % 117          Assessment & Plan:   Assessment and Plan Assessment & Plan Dyspnea on exertion Normal pulmonary function tests and echocardiogram. Differential includes deconditioning, cardiac issues.  He is unlikely to have any pulmonary disease with completely normal PFTs and a CT scan of the chest without any clear abnormality in 2022.  He quit smoking in 2014.  - Consider high-resolution CT chest if cardiac workup inconclusive. - Discuss CPET if other evaluations inconclusive. - Encouraged regular exercise for deconditioning.  I reviewed PFTs with the patient and his partner at bedside.  I answered all her questions and concerns.  Advised to follow-up as needed in the pulmonary clinic.        Larry Herter, MD [facility] Office: [phone]

## 2024-10-11 NOTE — Patient Instructions (Signed)
 Full PFT performed today.

## 2024-10-11 NOTE — Progress Notes (Signed)
 Full PFT performed today.

## 2024-10-11 NOTE — Progress Notes (Signed)
 Last read by Evalene Louder at 6:14PM on 10/09/2024.

## 2024-10-11 NOTE — Patient Instructions (Signed)
  VISIT SUMMARY: Today, we discussed your difficulty breathing during physical activities. We reviewed your recent medical history, including your smoking history, genetic mutation, and high triglycerides. We also went over your recent tests and current medications.  YOUR PLAN: -DYSPNEA ON EXERTION: Dyspnea on exertion means experiencing difficulty breathing during physical activities. Your pulmonary function tests and echocardiogram were normal, so we are considering other causes such as deconditioning or cardiac issues. You have been referred to a cardiologist for further evaluation. If the cardiologist does not find any issues, we may consider a high-resolution CT scan of your chest or a cardiopulmonary exercise test (CPET). Meanwhile, regular exercise is encouraged to help with deconditioning.  INSTRUCTIONS: Please follow up with the cardiologist as referred. If the cardiologist's evaluation is inconclusive, we may proceed with a high-resolution CT scan of your chest or a CPET. Continue with regular exercise to improve your conditioning.                      Contains text generated by [employer].
